# Patient Record
Sex: Female | Born: 1945 | Race: White | Hispanic: No | Smoking: Former smoker
Health system: Southern US, Community
[De-identification: ages and names within clinical notes are randomized; demographics above are authoritative.]

## PROBLEM LIST (undated history)

## (undated) DIAGNOSIS — E119 Type 2 diabetes mellitus without complications: Secondary | ICD-10-CM

## (undated) DIAGNOSIS — S42302A Unspecified fracture of shaft of humerus, left arm, initial encounter for closed fracture: Secondary | ICD-10-CM

## (undated) DIAGNOSIS — S82891A Other fracture of right lower leg, initial encounter for closed fracture: Secondary | ICD-10-CM

## (undated) DIAGNOSIS — E079 Disorder of thyroid, unspecified: Secondary | ICD-10-CM

## (undated) DIAGNOSIS — I10 Essential (primary) hypertension: Secondary | ICD-10-CM

## (undated) DIAGNOSIS — R32 Unspecified urinary incontinence: Secondary | ICD-10-CM

## (undated) HISTORY — DX: Unspecified urinary incontinence: R32

## (undated) HISTORY — DX: Disorder of thyroid, unspecified: E07.9

## (undated) HISTORY — DX: Essential (primary) hypertension: I10

## (undated) HISTORY — DX: Type 2 diabetes mellitus without complications: E11.9

## (undated) HISTORY — DX: Other fracture of right lower leg, initial encounter for closed fracture: S82.891A

---
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
## 1975-01-04 HISTORY — PX: ABDOMINAL HYSTERECTOMY: SHX81

## 1984-01-04 HISTORY — PX: THYROID SURGERY: SHX805

## 2002-05-01 ENCOUNTER — Emergency Department (HOSPITAL_COMMUNITY): Admission: EM | Admit: 2002-05-01 | Discharge: 2002-05-01 | Payer: Self-pay | Admitting: Emergency Medicine

## 2002-05-20 ENCOUNTER — Ambulatory Visit (HOSPITAL_COMMUNITY): Admission: RE | Admit: 2002-05-20 | Discharge: 2002-05-20 | Payer: Self-pay | Admitting: Gastroenterology

## 2003-02-11 ENCOUNTER — Encounter (INDEPENDENT_AMBULATORY_CARE_PROVIDER_SITE_OTHER): Payer: Self-pay | Admitting: *Deleted

## 2003-02-11 ENCOUNTER — Ambulatory Visit (HOSPITAL_COMMUNITY): Admission: RE | Admit: 2003-02-11 | Discharge: 2003-02-11 | Payer: Self-pay | Admitting: Endocrinology

## 2003-12-24 ENCOUNTER — Ambulatory Visit (HOSPITAL_COMMUNITY): Admission: RE | Admit: 2003-12-24 | Discharge: 2003-12-24 | Payer: Self-pay | Admitting: Endocrinology

## 2004-12-15 ENCOUNTER — Ambulatory Visit (HOSPITAL_COMMUNITY): Admission: RE | Admit: 2004-12-15 | Discharge: 2004-12-15 | Payer: Self-pay | Admitting: Endocrinology

## 2006-12-26 ENCOUNTER — Encounter: Admission: RE | Admit: 2006-12-26 | Discharge: 2006-12-26 | Payer: Self-pay | Admitting: Endocrinology

## 2007-07-20 DIAGNOSIS — M19029 Primary osteoarthritis, unspecified elbow: Secondary | ICD-10-CM | POA: Insufficient documentation

## 2008-07-10 ENCOUNTER — Encounter: Admission: RE | Admit: 2008-07-10 | Discharge: 2008-07-10 | Payer: Self-pay | Admitting: Endocrinology

## 2009-08-05 ENCOUNTER — Encounter: Admission: RE | Admit: 2009-08-05 | Discharge: 2009-08-05 | Payer: Self-pay | Admitting: Endocrinology

## 2009-10-16 ENCOUNTER — Encounter: Admission: RE | Admit: 2009-10-16 | Discharge: 2009-10-16 | Payer: Self-pay | Admitting: Family Medicine

## 2010-01-24 ENCOUNTER — Encounter: Payer: Self-pay | Admitting: Endocrinology

## 2010-04-15 DIAGNOSIS — E785 Hyperlipidemia, unspecified: Secondary | ICD-10-CM | POA: Insufficient documentation

## 2010-04-15 DIAGNOSIS — E039 Hypothyroidism, unspecified: Secondary | ICD-10-CM | POA: Insufficient documentation

## 2010-04-15 DIAGNOSIS — I10 Essential (primary) hypertension: Secondary | ICD-10-CM | POA: Insufficient documentation

## 2010-05-21 NOTE — Op Note (Signed)
NAME:  [name], Helen                            ACCOUNT NO.:  [id number]   MEDICAL RECORD NO.:  [id number]                   PATIENT TYPE:  AMB   LOCATION:  ENDO                                 FACILITY:  [facility]   PHYSICIAN:  Anselmo Rod, M.D.               DATE OF BIRTH:  [birth year]   DATE OF PROCEDURE:  05/20/2002  DATE OF DISCHARGE:                                 OPERATIVE REPORT   PROCEDURE PERFORMED:  Screening colonoscopy.   ENDOSCOPIST:  Anselmo Rod, M.D.   INSTRUMENT USED:  Olympus video colonoscope.   INDICATIONS FOR PROCEDURE:  A 65 year old white female with a history of  occasional bright red blood per rectum.  Rule out colonic polyps, masses,  hemorrhoids, etc.   PREPROCEDURE PREPARATION:  Informed consent was procured from the patient.  The patient was fasted for eight hours prior to the procedure and prepped  with a bottle of magnesium citrate and a gallon of GoLYTELY the night prior  to the procedure.   PREPROCEDURE PHYSICAL:  VITAL SIGNS:  The patient had stable vital signs.  NECK:  Supple.  CHEST: Clear to auscultation.  S1 and S2 regular.  ABDOMEN: Soft with normal bowel sounds.   DESCRIPTION OF PROCEDURE:  The patient was placed in the left lateral  decubitus position and sedated with 70 mg of Demerol and 7 mg of Versed  intravenously.  Once the patient was adequately sedated and maintained on  low flow oxygen and continuous cardiac monitoring, the Olympus video  colonoscope was advanced from the rectum to the cecum without difficulty.  Some residual stool was present in the colon.  Multiple washings were done.  Scattered diverticula of varying size was seen throughout the colon.  A  small amount of internal and external hemorrhoids were seen on retroflexion  of the anus and rectum, respectively.  The procedure was completed.  The  area of the appendiceal orifice and ileocecal valve were clearly visualized  and photographed.  No masses, polyps,  erosions, ulcerations, etc., were  appreciated.  The patient tolerated the procedure well without  complications.   IMPRESSION:  1. Scattered diverticulosis.  2. A small amount of internal and external hemorrhoids.  3. No masses or polyps seen.    RECOMMENDATIONS:  1. High fiber diet with 20-25 g of fiber in the diet has been recommended     with liberal fluid intake.  2. Outpatient follow up in the next two weeks for further recommendations.     The patient has been asked to call the office earlier if she has any     problems in the interim.  Anselmo Rod, M.D.    JNM/MEDQ  D:  05/20/2002  T:  05/20/2002  Job:  [id number]   cc:   Marjory Lies, M.D.  [address]  [place]  [place] [address]  Fax: [phone]

## 2010-10-01 ENCOUNTER — Other Ambulatory Visit: Payer: Self-pay | Admitting: Family Medicine

## 2010-10-01 DIAGNOSIS — Z1231 Encounter for screening mammogram for malignant neoplasm of breast: Secondary | ICD-10-CM

## 2010-11-04 ENCOUNTER — Ambulatory Visit
Admission: RE | Admit: 2010-11-04 | Discharge: 2010-11-04 | Disposition: A | Discharge status: Home / Self Care | Payer: No Typology Code available for payment source | Source: Ambulatory Visit | Attending: Family Medicine | Admitting: Family Medicine

## 2010-11-04 DIAGNOSIS — Z1231 Encounter for screening mammogram for malignant neoplasm of breast: Secondary | ICD-10-CM

## 2012-01-04 DIAGNOSIS — S82891A Other fracture of right lower leg, initial encounter for closed fracture: Secondary | ICD-10-CM

## 2012-01-04 HISTORY — DX: Other fracture of right lower leg, initial encounter for closed fracture: S82.891A

## 2012-01-27 ENCOUNTER — Other Ambulatory Visit: Payer: Self-pay | Admitting: Nurse Practitioner

## 2012-01-27 DIAGNOSIS — Z1231 Encounter for screening mammogram for malignant neoplasm of breast: Secondary | ICD-10-CM

## 2012-02-23 ENCOUNTER — Ambulatory Visit
Admission: RE | Admit: 2012-02-23 | Discharge: 2012-02-23 | Disposition: A | Discharge status: Home / Self Care | Payer: Medicare Other | Source: Ambulatory Visit | Attending: Nurse Practitioner | Admitting: Nurse Practitioner

## 2012-02-23 DIAGNOSIS — Z1231 Encounter for screening mammogram for malignant neoplasm of breast: Secondary | ICD-10-CM

## 2012-08-27 ENCOUNTER — Other Ambulatory Visit: Payer: Self-pay | Admitting: Advanced Practice Midwife

## 2012-08-29 ENCOUNTER — Other Ambulatory Visit: Payer: Self-pay | Admitting: Advanced Practice Midwife

## 2012-08-31 ENCOUNTER — Other Ambulatory Visit: Payer: Self-pay | Admitting: Advanced Practice Midwife

## 2012-09-04 ENCOUNTER — Other Ambulatory Visit: Payer: Self-pay | Admitting: *Deleted

## 2012-09-05 NOTE — Telephone Encounter (Signed)
2nd voicemail left to [facility] that she is not a patient here

## 2013-03-22 ENCOUNTER — Other Ambulatory Visit: Payer: Self-pay | Admitting: Nurse Practitioner

## 2013-03-22 ENCOUNTER — Other Ambulatory Visit: Payer: Self-pay

## 2013-03-22 DIAGNOSIS — Z1231 Encounter for screening mammogram for malignant neoplasm of breast: Secondary | ICD-10-CM

## 2013-04-15 ENCOUNTER — Other Ambulatory Visit: Payer: Self-pay | Admitting: Nurse Practitioner

## 2013-04-15 DIAGNOSIS — Z780 Asymptomatic menopausal state: Secondary | ICD-10-CM

## 2013-04-23 ENCOUNTER — Ambulatory Visit
Admission: RE | Admit: 2013-04-23 | Discharge: 2013-04-23 | Disposition: A | Discharge status: Home / Self Care | Payer: Medicare Other | Source: Ambulatory Visit | Attending: Nurse Practitioner | Admitting: Nurse Practitioner

## 2013-04-23 ENCOUNTER — Encounter (INDEPENDENT_AMBULATORY_CARE_PROVIDER_SITE_OTHER): Payer: Self-pay

## 2013-04-23 DIAGNOSIS — Z780 Asymptomatic menopausal state: Secondary | ICD-10-CM

## 2013-04-23 DIAGNOSIS — Z1231 Encounter for screening mammogram for malignant neoplasm of breast: Secondary | ICD-10-CM

## 2013-06-26 DIAGNOSIS — Z794 Long term (current) use of insulin: Secondary | ICD-10-CM | POA: Insufficient documentation

## 2014-03-28 ENCOUNTER — Other Ambulatory Visit: Payer: Self-pay

## 2014-03-28 DIAGNOSIS — Z1231 Encounter for screening mammogram for malignant neoplasm of breast: Secondary | ICD-10-CM

## 2014-04-28 ENCOUNTER — Ambulatory Visit
Admission: RE | Admit: 2014-04-28 | Discharge: 2014-04-28 | Disposition: A | Discharge status: Home / Self Care | Payer: Medicare Other | Source: Ambulatory Visit

## 2014-04-28 DIAGNOSIS — Z1231 Encounter for screening mammogram for malignant neoplasm of breast: Secondary | ICD-10-CM

## 2015-05-14 ENCOUNTER — Other Ambulatory Visit: Payer: Self-pay

## 2015-05-14 DIAGNOSIS — Z1231 Encounter for screening mammogram for malignant neoplasm of breast: Secondary | ICD-10-CM

## 2015-05-26 ENCOUNTER — Ambulatory Visit: Payer: Medicare Other

## 2015-06-05 ENCOUNTER — Ambulatory Visit
Admission: RE | Admit: 2015-06-05 | Discharge: 2015-06-05 | Disposition: A | Discharge status: Home / Self Care | Payer: Medicare Other | Source: Ambulatory Visit

## 2015-06-05 DIAGNOSIS — Z1231 Encounter for screening mammogram for malignant neoplasm of breast: Secondary | ICD-10-CM

## 2015-08-29 ENCOUNTER — Other Ambulatory Visit: Payer: Self-pay | Admitting: Nurse Practitioner

## 2015-08-29 DIAGNOSIS — M810 Age-related osteoporosis without current pathological fracture: Secondary | ICD-10-CM

## 2015-09-01 ENCOUNTER — Ambulatory Visit
Admission: RE | Admit: 2015-09-01 | Discharge: 2015-09-01 | Disposition: A | Discharge status: Home / Self Care | Payer: Medicare Other | Source: Ambulatory Visit | Attending: Nurse Practitioner | Admitting: Nurse Practitioner

## 2015-09-01 DIAGNOSIS — M810 Age-related osteoporosis without current pathological fracture: Secondary | ICD-10-CM

## 2016-03-31 ENCOUNTER — Other Ambulatory Visit: Payer: Self-pay | Admitting: Endocrinology

## 2016-03-31 DIAGNOSIS — R197 Diarrhea, unspecified: Secondary | ICD-10-CM

## 2016-04-07 ENCOUNTER — Ambulatory Visit
Admission: RE | Admit: 2016-04-07 | Discharge: 2016-04-07 | Disposition: A | Discharge status: Home / Self Care | Payer: Medicare Other | Source: Ambulatory Visit | Attending: Endocrinology | Admitting: Endocrinology

## 2016-04-07 DIAGNOSIS — R197 Diarrhea, unspecified: Secondary | ICD-10-CM

## 2016-04-29 ENCOUNTER — Other Ambulatory Visit: Payer: Self-pay | Admitting: Nurse Practitioner

## 2016-04-29 DIAGNOSIS — Z1231 Encounter for screening mammogram for malignant neoplasm of breast: Secondary | ICD-10-CM

## 2016-06-06 ENCOUNTER — Ambulatory Visit
Admission: RE | Admit: 2016-06-06 | Discharge: 2016-06-06 | Disposition: A | Discharge status: Home / Self Care | Payer: Medicare Other | Source: Ambulatory Visit | Attending: Nurse Practitioner | Admitting: Nurse Practitioner

## 2016-06-06 DIAGNOSIS — Z1231 Encounter for screening mammogram for malignant neoplasm of breast: Secondary | ICD-10-CM

## 2016-09-14 DIAGNOSIS — E119 Type 2 diabetes mellitus without complications: Secondary | ICD-10-CM | POA: Insufficient documentation

## 2017-03-15 ENCOUNTER — Other Ambulatory Visit: Payer: Self-pay | Admitting: Internal Medicine

## 2017-03-15 DIAGNOSIS — E042 Nontoxic multinodular goiter: Secondary | ICD-10-CM

## 2017-03-22 ENCOUNTER — Ambulatory Visit
Admission: RE | Admit: 2017-03-22 | Discharge: 2017-03-22 | Disposition: A | Discharge status: Home / Self Care | Payer: Medicare Other | Source: Ambulatory Visit | Attending: Internal Medicine | Admitting: Internal Medicine

## 2017-03-22 DIAGNOSIS — E042 Nontoxic multinodular goiter: Secondary | ICD-10-CM

## 2017-04-10 ENCOUNTER — Encounter: Payer: Self-pay | Admitting: Registered"

## 2017-04-10 ENCOUNTER — Encounter: Payer: Medicare Other | Attending: Internal Medicine | Admitting: Registered"

## 2017-04-10 DIAGNOSIS — Z713 Dietary counseling and surveillance: Secondary | ICD-10-CM | POA: Diagnosis not present

## 2017-04-10 DIAGNOSIS — E119 Type 2 diabetes mellitus without complications: Secondary | ICD-10-CM

## 2017-04-10 NOTE — Patient Instructions (Addendum)
Goals:  Follow Diabetes Meal Plan as instructed  Eat 3 meals and 2 snacks, every 3-5 hrs  Limit carbohydrate intake to 30-45 grams carbohydrate/meal  Limit carbohydrate intake to 15 grams carbohydrate/snack  Add lean protein foods to meals/snacks  Monitor glucose levels as instructed by your doctor  Aim for 30 mins of physical activity daily  Bring food record and glucose log to your next nutrition visit  Follow-up yearly or as needed with dietitian

## 2017-04-10 NOTE — Progress Notes (Signed)
Diabetes Self-Management Education  Visit Type: First/Initial  Appt. Start Time: 10:45 Appt. End Time: 11:50  04/10/2017  Pt expectations: learn how to read labels related to carbohydrates and protein; also learn about portion sizes  [name], identified by name and date of birth, is a 72 y.o. female with a diagnosis of Diabetes: Type 2. 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 Pt states she is lazy sometimes. Pt states she has supportive family members in this area and in [place], [place]. Pt states she plans to join [employer] to establish physical activity routine of 3 days/week.   ASSESSMENT  Height 5\' 4"  (1.626 m), weight 177 lb 12.8 oz (80.6 kg). Body mass index is 30.52 kg/m.  Diabetes Self-Management Education - 04/10/17 1047      Visit Information   Visit Type  First/Initial      Initial Visit   Diabetes Type  Type 2    Are you currently following a meal plan?  Yes    What type of meal plan do you follow?  Low carb    Are you taking your medications as prescribed?  Yes    Date Diagnosed  1990's      Health Coping   How would you rate your overall health?  Fair      Psychosocial Assessment   Patient Belief/Attitude about Diabetes  Motivated to manage diabetes    Self-care barriers  None    Self-management support  Doctor's office    Other persons present  Patient    Patient Concerns  Nutrition/Meal planning    Special Needs  None    Preferred Learning Style  No preference indicated    Learning Readiness  Ready    How often do you need to have someone help you when you read instructions, pamphlets, or other written materials from your doctor or pharmacy?  1 - Never    What is the last grade level you completed in school?  76PP grade      Complications   Last HgB A1C per patient/outside source  8.1 %    How often do you check your blood sugar?  3-4 times/day    Fasting Blood glucose  range (mg/dL)  70-129    Postprandial Blood glucose range (mg/dL)  130-179    Number of hypoglycemic episodes per month  2    Can you tell when your blood sugar is low?  Yes    What do you do if your blood sugar is low?  eats crackers     Number of hyperglycemic episodes per week  1    Can you tell when your blood sugar is high?  No    Have you had a dilated eye exam in the past 12 months?  Yes    Have you had a dental exam in the past 12 months?  Yes    Are you checking your feet?  Yes    How many days per week are you checking your feet?  2      Dietary Intake   Breakfast  2 boiled eggs, bacon    Snack (morning)  none    Lunch  salad, tuna    Snack (afternoon)  none    Dinner  stirred fry chicken, peppers, broccoli, onions    Snack (evening)  none    Beverage(s)  water, diet Sundrop, crystal light  Exercise   Exercise Type  Light (walking / raking leaves)    How many days per week to you exercise?  3    How many minutes per day do you exercise?  15    Total minutes per week of exercise  45      Patient Education   Disease state   Definition of diabetes, type 1 and 2, and the diagnosis of diabetes;Factors that contribute to the development of diabetes    Nutrition management   Carbohydrate counting;Food label reading, portion sizes and measuring food.;Reviewed blood glucose goals for pre and post meals and how to evaluate the patients' food intake on their blood glucose level.;Meal options for control of blood glucose level and chronic complications.    Physical activity and exercise   Role of exercise on diabetes management, blood pressure control and cardiac health.;Helped patient identify appropriate exercises in relation to his/her diabetes, diabetes complications and other health issue.    Monitoring  Purpose and frequency of SMBG.;Identified appropriate SMBG and/or A1C goals.;Taught/discussed recording of test results and interpretation of SMBG.;Interpreting lab values -  A1C, lipid, urine microalbumina.;Yearly dilated eye exam;Daily foot exams    Acute complications  Taught treatment of hypoglycemia - the 15 rule.;Discussed and identified patients' treatment of hyperglycemia.    Chronic complications  Relationship between chronic complications and blood glucose control;Lipid levels, blood glucose control and heart disease;Dental care;Assessed and discussed foot care and prevention of foot problems;Identified and discussed with patient  current chronic complications;Retinopathy and reason for yearly dilated eye exams;Reviewed with patient heart disease, higher risk of, and prevention    Psychosocial adjustment  Helped patient identify a support system for diabetes management;Identified and addressed patients feelings and concerns about diabetes;Role of stress on diabetes      Individualized Goals (developed by patient)   Nutrition  General guidelines for healthy choices and portions discussed    Physical Activity  Exercise 3-5 times per week    Medications  take my medication as prescribed    Monitoring   test my blood glucose as discussed    Reducing Risk  treat hypoglycemia with 15 grams of carbs if blood glucose less than 70mg /dL;do foot checks daily    Health Coping  Not Applicable      Post-Education Assessment   Patient understands the diabetes disease and treatment process.  Demonstrates understanding / competency    Patient understands incorporating nutritional management into lifestyle.  Demonstrates understanding / competency    Patient undertands incorporating physical activity into lifestyle.  Demonstrates understanding / competency    Patient understands using medications safely.  Demonstrates understanding / competency    Patient understands monitoring blood glucose, interpreting and using results  Demonstrates understanding / competency    Patient understands prevention, detection, and treatment of acute complications.  Demonstrates understanding /  competency    Patient understands prevention, detection, and treatment of chronic complications.  Demonstrates understanding / competency    Patient understands how to develop strategies to address psychosocial issues.  Demonstrates understanding / competency    Patient understands how to develop strategies to promote health/change behavior.  Demonstrates understanding / competency      Outcomes   Expected Outcomes  Demonstrated interest in learning. Expect positive outcomes    Future DMSE  Other (comment) pt wants to return in 7 months before the holiday season begins    Program Status  Completed       Individualized Plan for Diabetes Self-Management Training:   Learning Objective:  Patient will have a greater understanding of diabetes self-management. Patient education plan is to attend individual and/or group sessions per assessed needs and concerns.   Plan:   Patient Instructions  Goals:  Follow Diabetes Meal Plan as instructed  Eat 3 meals and 2 snacks, every 3-5 hrs  Limit carbohydrate intake to 30-45 grams carbohydrate/meal  Limit carbohydrate intake to 15 grams carbohydrate/snack  Add lean protein foods to meals/snacks  Monitor glucose levels as instructed by your doctor  Aim for 30 mins of physical activity daily  Bring food record and glucose log to your next nutrition visit  Follow-up yearly or as needed with dietitian      Expected Outcomes:  Demonstrated interest in learning. Expect positive outcomes  Education material provided: Living Well with Diabetes, Food label handouts, Meal plan card, Support group flyer and Carbohydrate counting sheet  If problems or questions, patient to contact team via:  Phone and Email  Future DSME appointment: Other (comment)(pt wants to return in 7 months before the holiday season begins)

## 2017-05-30 ENCOUNTER — Emergency Department (HOSPITAL_COMMUNITY): Payer: Medicare Other

## 2017-05-30 ENCOUNTER — Other Ambulatory Visit: Payer: Self-pay

## 2017-05-30 ENCOUNTER — Emergency Department (HOSPITAL_COMMUNITY)
Admission: EM | Admit: 2017-05-30 | Discharge: 2017-05-30 | Disposition: A | Discharge status: Home / Self Care | Payer: Medicare Other | Attending: Emergency Medicine | Admitting: Emergency Medicine

## 2017-05-30 ENCOUNTER — Encounter (HOSPITAL_COMMUNITY): Payer: Self-pay | Admitting: *Deleted

## 2017-05-30 DIAGNOSIS — R197 Diarrhea, unspecified: Secondary | ICD-10-CM | POA: Insufficient documentation

## 2017-05-30 DIAGNOSIS — R1013 Epigastric pain: Secondary | ICD-10-CM | POA: Insufficient documentation

## 2017-05-30 DIAGNOSIS — I10 Essential (primary) hypertension: Secondary | ICD-10-CM | POA: Insufficient documentation

## 2017-05-30 DIAGNOSIS — R112 Nausea with vomiting, unspecified: Secondary | ICD-10-CM | POA: Insufficient documentation

## 2017-05-30 DIAGNOSIS — Z7984 Long term (current) use of oral hypoglycemic drugs: Secondary | ICD-10-CM | POA: Insufficient documentation

## 2017-05-30 DIAGNOSIS — Z87891 Personal history of nicotine dependence: Secondary | ICD-10-CM | POA: Diagnosis not present

## 2017-05-30 DIAGNOSIS — N9489 Other specified conditions associated with female genital organs and menstrual cycle: Secondary | ICD-10-CM

## 2017-05-30 DIAGNOSIS — Z79899 Other long term (current) drug therapy: Secondary | ICD-10-CM | POA: Diagnosis not present

## 2017-05-30 DIAGNOSIS — E119 Type 2 diabetes mellitus without complications: Secondary | ICD-10-CM | POA: Diagnosis not present

## 2017-05-30 DIAGNOSIS — R079 Chest pain, unspecified: Secondary | ICD-10-CM | POA: Diagnosis present

## 2017-05-30 DIAGNOSIS — R0789 Other chest pain: Secondary | ICD-10-CM

## 2017-05-30 DIAGNOSIS — N83209 Unspecified ovarian cyst, unspecified side: Secondary | ICD-10-CM | POA: Insufficient documentation

## 2017-05-30 DIAGNOSIS — Z7982 Long term (current) use of aspirin: Secondary | ICD-10-CM | POA: Diagnosis not present

## 2017-05-30 LAB — CBC
HEMATOCRIT: 41.8 % (ref 36.0–46.0)
Hemoglobin: 13.2 g/dL (ref 12.0–15.0)
MCH: 27.3 pg (ref 26.0–34.0)
MCHC: 31.6 g/dL (ref 30.0–36.0)
MCV: 86.5 fL (ref 78.0–100.0)
Platelets: 373 10*3/uL (ref 150–400)
RBC: 4.83 MIL/uL (ref 3.87–5.11)
RDW: 13.1 % (ref 11.5–15.5)
WBC: 10.7 10*3/uL — ABNORMAL HIGH (ref 4.0–10.5)

## 2017-05-30 LAB — HEPATIC FUNCTION PANEL
ALK PHOS: 38 U/L (ref 38–126)
ALT: 19 U/L (ref 14–54)
AST: 19 U/L (ref 15–41)
Albumin: 3.9 g/dL (ref 3.5–5.0)
Bilirubin, Direct: <0.1 mg/dL — ABNORMAL LOW (ref 0.1–0.5)
Total Bilirubin: 0.7 mg/dL (ref 0.3–1.2)
Total Protein: 6.9 g/dL (ref 6.5–8.1)

## 2017-05-30 LAB — BASIC METABOLIC PANEL
Anion gap: 10 (ref 5–15)
BUN: 28 mg/dL — AB (ref 6–20)
CHLORIDE: 102 mmol/L (ref 101–111)
CO2: 23 mmol/L (ref 22–32)
CREATININE: 0.67 mg/dL (ref 0.44–1.00)
Calcium: 9.2 mg/dL (ref 8.9–10.3)
GFR calc Af Amer: >60 mL/min (ref >60)
Glucose, Bld: 150 mg/dL — ABNORMAL HIGH (ref 65–99)
POTASSIUM: 4 mmol/L (ref 3.5–5.1)
Sodium: 135 mmol/L (ref 135–145)

## 2017-05-30 LAB — I-STAT TROPONIN, ED: Troponin i, poc: 0 ng/mL (ref 0.00–0.08)

## 2017-05-30 LAB — LIPASE, BLOOD: Lipase: 36 U/L (ref 11–51)

## 2017-05-30 LAB — TROPONIN I: Troponin I: <0.03 ng/mL (ref <0.03)

## 2017-05-30 MED ORDER — MORPHINE SULFATE (PF) 4 MG/ML IV SOLN
4.0000 mg | Freq: Once | INTRAVENOUS | Status: AC
  Administered 2017-05-30: 4 mg via INTRAVENOUS
  Filled 2017-05-30: qty 1

## 2017-05-30 MED ORDER — ONDANSETRON HCL 4 MG/2ML IJ SOLN
4.0000 mg | Freq: Once | INTRAMUSCULAR | Status: AC
  Administered 2017-05-30: 4 mg via INTRAVENOUS
  Filled 2017-05-30: qty 2

## 2017-05-30 MED ORDER — NITROGLYCERIN 2 % TD OINT
0.5000 [in_us] | TOPICAL_OINTMENT | Freq: Once | TRANSDERMAL | Status: AC
  Administered 2017-05-30: 0.5 [in_us] via TOPICAL
  Filled 2017-05-30: qty 1

## 2017-05-30 MED ORDER — IOPAMIDOL (ISOVUE-300) INJECTION 61%
100.0000 mL | Freq: Once | INTRAVENOUS | Status: AC | PRN
  Administered 2017-05-30: 100 mL via INTRAVENOUS

## 2017-05-30 MED ORDER — FAMOTIDINE IN NACL 20-0.9 MG/50ML-% IV SOLN
20.0000 mg | Freq: Once | INTRAVENOUS | Status: AC
  Administered 2017-05-30: 20 mg via INTRAVENOUS
  Filled 2017-05-30: qty 50

## 2017-05-30 NOTE — ED Provider Notes (Signed)
[facility] EMERGENCY DEPARTMENT Provider Note   CSN: [id number] Arrival date & time: 05/30/17  0501  Time seen 05:50 AM   History   Chief Complaint Chief Complaint  Patient presents with  . Chest Pain    HPI Helen [name] is a 72 y.o. female.  HPI patient reports about 2 AM she started having lower sternal central chest pain that she describes as pressure.  It does not radiate.  She denies shortness of breath but has had nausea and vomiting.  911 had her to 4 baby aspirin and she states now she is having some reflux with burning fluid from the aspirin.  She states she is never had chest pain like this before.  She denies any history of heart problems.  She does have hypertension and diabetes.  Past Medical History:  Diagnosis Date  . Diabetes mellitus without complication ([place])   . Hypertension     There are no active problems to display for this patient.   History reviewed. No pertinent surgical history.   OB History   None      Home Medications    Prior to Admission medications   Medication Sig Start Date End Date Taking? Authorizing Provider  amLODipine (NORVASC) 10 MG tablet Take 10 mg by mouth daily.   Yes [provider]  amlodipine-atorvastatin (CADUET) 10-10 MG tablet Take 1 tablet by mouth daily.   Yes [provider]  aspirin EC 81 MG tablet Take 81 mg by mouth daily.   Yes [provider]  empagliflozin (JARDIANCE) 25 MG TABS tablet Take 25 mg by mouth daily.   Yes [provider]  fenofibrate 160 MG tablet Take 160 mg by mouth daily.   Yes [provider]  FLUoxetine (PROZAC) 40 MG capsule Take 40 mg by mouth daily.   Yes [provider]  glimepiride (AMARYL) 4 MG tablet Take 4 mg by mouth daily with breakfast.   Yes [provider]  lisinopril-hydrochlorothiazide (PRINZIDE,ZESTORETIC) 20-25 MG tablet Take 1 tablet by mouth daily.   Yes [provider]  meclizine (ANTIVERT) 12.5 MG  tablet Take 12.5 mg by mouth 3 (three) times daily as needed for dizziness.   Yes [provider]  metFORMIN (GLUCOPHAGE) 500 MG tablet Take by mouth 2 (two) times daily with a meal.   Yes [provider]  metoprolol succinate (TOPROL-XL) 100 MG 24 hr tablet Take 100 mg by mouth daily. Take with or immediately following a meal.   Yes [provider]  MULTIPLE VITAMINS ESSENTIAL PO Take by mouth.   Yes [provider]  omeprazole (PRILOSEC) 20 MG capsule Take 20 mg by mouth daily.   Yes [provider]  simvastatin (ZOCOR) 40 MG tablet Take 40 mg by mouth daily.   Yes [provider]    Family History Family History  Problem Relation Age of Onset  . Breast cancer Mother 81  . Cancer Other   . Hypertension Other   . Stroke Other   . Heart disease Other   . Diabetes Other     Social History Social History   Tobacco Use  . Smoking status: Former Research scientist (life sciences)  . Smokeless tobacco: Never Used  Substance Use Topics  . Alcohol use: Not on file  . Drug use: Not on file  lives at home Denies smoking   Allergies   Patient has no known allergies.   Review of Systems Review of Systems  All other systems reviewed and are negative.  Physical Exam Updated Vital Signs BP (!) 121/53   Pulse 63   Temp 98.2 F (36.8 C) (Oral)   Resp 13   Ht 5\' 4"  (1.626 m)   Wt 80.3 kg (177 lb)   SpO2 94%   BMI 30.38 kg/m   Physical Exam  Constitutional: She is oriented to person, place, and time. She appears well-developed and well-nourished.  Non-toxic appearance. She does not appear ill. She appears distressed.  Gagging during exam  HENT:  Head: Normocephalic and atraumatic.  Right Ear: External ear normal.  Left Ear: External ear normal.  Nose: Nose normal. No mucosal edema or rhinorrhea.  Mouth/Throat: Oropharynx is clear and moist and mucous membranes are normal. No dental abscesses or uvula swelling.  Eyes: Pupils are equal, round,  and reactive to light. Conjunctivae and EOM are normal.  Neck: Normal range of motion and full passive range of motion without pain. Neck supple.  Cardiovascular: Normal rate, regular rhythm and normal heart sounds. Exam reveals no gallop and no friction rub.  No murmur heard. Pulmonary/Chest: Effort normal and breath sounds normal. No respiratory distress. She has no wheezes. She has no rhonchi. She has no rales. She exhibits no tenderness and no crepitus.  Area of pain noted    Abdominal: Soft. Normal appearance and bowel sounds are normal. She exhibits no distension. There is no tenderness. There is no rebound and no guarding.  Musculoskeletal: Normal range of motion. She exhibits no edema or tenderness.  Moves all extremities well.   Neurological: She is alert and oriented to person, place, and time. She has normal strength. No cranial nerve deficit.  Skin: Skin is warm and intact. No rash noted. She is diaphoretic. No erythema. No pallor.  Psychiatric: She has a normal mood and affect. Her speech is normal and behavior is normal. Her mood appears not anxious.  Nursing note and vitals reviewed.    ED Treatments / Results  Labs (all labs ordered are listed, but only abnormal results are displayed) Results for orders placed or performed during the hospital encounter of 06/26/[birth year]  Basic metabolic panel  Result Value Ref Range   Sodium 135 135 - 145 mmol/L   Potassium 4.0 3.5 - 5.1 mmol/L   Chloride 102 101 - 111 mmol/L   CO2 23 22 - 32 mmol/L   Glucose, Bld 150 (H) 65 - 99 mg/dL   BUN 28 (H) 6 - 20 mg/dL   Creatinine, Ser 0.67 0.44 - 1.00 mg/dL   Calcium 9.2 8.9 - 10.3 mg/dL   GFR calc non Af Amer >60 >60 mL/min   GFR calc Af Amer >60 >60 mL/min   Anion gap 10 5 - 15  CBC  Result Value Ref Range   WBC 10.7 (H) 4.0 - 10.5 K/uL   RBC 4.83 3.87 - 5.11 MIL/uL   Hemoglobin 13.2 12.0 - 15.0 g/dL   HCT 41.8 36.0 - 46.0 %   MCV 86.5 78.0 - 100.0 fL   MCH 27.3 26.0 - 34.0 pg   MCHC  31.6 30.0 - 36.0 g/dL   RDW 13.1 11.5 - 15.5 %   Platelets 373 150 - 400 K/uL  Hepatic function panel  Result Value Ref Range   Total Protein 6.9 6.5 - 8.1 g/dL   Albumin 3.9 3.5 - 5.0 g/dL   AST 19 15 - 41 U/L   ALT 19 14 - 54 U/L   Alkaline Phosphatase 38 38 - 126 U/L   Total Bilirubin 0.7 0.3 -  1.2 mg/dL   Bilirubin, Direct <0.1 (L) 0.1 - 0.5 mg/dL   Indirect Bilirubin NOT CALCULATED 0.3 - 0.9 mg/dL  Lipase, blood  Result Value Ref Range   Lipase 36 11 - 51 U/L  I-stat troponin, ED  Result Value Ref Range   Troponin i, poc 0.00 0.00 - 0.08 ng/mL   Comment 3           Laboratory interpretation all normal except mild leukocytosis, mild hyperglycemia     EKG EKG Interpretation  Date/Time:  Tuesday May 30 2017 05:20:38 EDT Ventricular Rate:  59 PR Interval:    QRS Duration: 93 QT Interval:  465 QTC Calculation: 461 R Axis:   27 Text Interpretation:  Sinus rhythm Normal ECG No old tracing to compare Confirmed by Rolland Porter [phone]) on 05/30/2017 6:02:06 AM   Radiology Dg Chest Port 1 View  Result Date: 05/30/2017 CLINICAL DATA:  Central chest pressure woke patient up around 2 a.m. Nausea. 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 EXAM: PORTABLE CHEST 1 VIEW COMPARISON:  11/03/2006 FINDINGS: The heart size and mediastinal contours are within normal limits. Both lungs are clear. The visualized skeletal structures are unremarkable. IMPRESSION: No active disease. Electronically Signed   By: Lucienne Capers M.D.   On: 05/30/2017 06:06    Procedures Procedures (including critical care time)  Medications Ordered in ED Medications  famotidine (PEPCID) IVPB 20 mg premix (20 mg Intravenous New Bag/Given 05/30/17 0709)  ondansetron (ZOFRAN) injection 4 mg (4 mg Intravenous Given 05/30/17 0525)  ondansetron (ZOFRAN) injection 4 mg (4 mg Intravenous Given 05/30/17 0557)  nitroGLYCERIN (NITROGLYN) 2 % ointment 0.5 inch (0.5 inches Topical Given 05/30/17 0603)  morphine  4 MG/ML injection 4 mg (4 mg Intravenous Given 05/30/17 0603)     Initial Impression / Assessment and Plan / ED Course  I have reviewed the triage vital signs and the nursing notes.  Pertinent labs & imaging results that were available during my care of the patient were reviewed by me and considered in my medical decision making (see chart for details).    Patient appears distressed, she was given Zofran twice for nausea and nitroglycerin paste.  She was given IV morphine.  Recheck at 6:50 AM her blood tests have resulted and are all basically normal.  We discussed getting a second troponin in a couple hours, she also was recently started on doxycycline which she is never taken before.  Sometimes it can cause a lot of GI distress.  Also gallbladder disease can mimic an MI.  Therefore ultrasound of the gallbladder and CT the abdomen pelvis is going to be done.  Patient states her pain is almost gone now.  She appears to feel better.  PT left with Dr Thurnell Garbe at change of shift to get her [place] RUQ and AP CT results and her delta troponin.    Final Clinical Impressions(s) / ED Diagnoses   Final diagnoses:  Atypical chest pain    Disposition pending  Rolland Porter, MD, Barbette Or, MD 05/30/17 0730

## 2017-05-30 NOTE — Discharge Instructions (Signed)
Your CT scan showed an incidental finding:  "Approximately 3.2 cm hypoattenuating right-sided adnexal lesion, potentially a complicated/hemorrhagic ovarian cyst, however this is an atypical finding in a postmenopausal patient. Further evaluation with nonemergent pelvic ultrasound could be performed as indicated."  Call your OB/GYN doctor today to schedule a follow up appointment within the next week for this finding.  Take your usual prescriptions as previously directed.  Call your regular medical doctor today to schedule a follow up appointment within the next 2 days. Call the Cardiologist today to schedule a follow up appointment within the next week.  Return to the Emergency Department immediately sooner if worsening.

## 2017-05-30 NOTE — ED Triage Notes (Signed)
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

## 2017-05-30 NOTE — ED Notes (Signed)
ED Provider at bedside. 

## 2017-05-30 NOTE — ED Notes (Signed)
Pt c/o mid center chest pressure that woke her up from sleep, pain is non radiating, is associated with nausea, pt pale, skin clammy, pt nsr to sinus brady on monitor, ekg performed, given to Dr Tomi Bamberger,

## 2017-05-30 NOTE — ED Provider Notes (Signed)
Pt received at sign out with delta troponin, CT A/P and [place] RUQ pending. Pt states she woke up at 0200 with lower mid-sternal chest "pressure," N/V and then one episode of diarrhea. Pt states after vomiting, she had "reflux with burning." States she was recently started on doxycycline. Pt states she began to feel better after the IV pain and nausea medications. Pt NAD, non-toxic appearing, resps easy, abd soft/NT. Pt states she wants to go home now. Doubt PE as cause for symptoms with low risk [place].  Doubt ACS as cause for symptoms with normal troponin x2 and unchanged EKG from previous after 6+ hours of several hours of constant/atypical symptoms. CT with incidental adnexal finding; will need OB/GYN MD f/u.  Dx and testing d/w pt and family.  Questions answered.  Verb understanding, agreeable to d/c home with outpt f/u.    BP (!) 119/57   Pulse 63   Temp 98.2 F (36.8 C) (Oral)   Resp 16   Ht 5\' 4"  (1.626 m)   Wt 80.3 kg (177 lb)   SpO2 96%   BMI 30.38 kg/m    Results for orders placed or performed during the hospital encounter of 59/56/38  Basic metabolic panel  Result Value Ref Range   Sodium 135 135 - 145 mmol/L   Potassium 4.0 3.5 - 5.1 mmol/L   Chloride 102 101 - 111 mmol/L   CO2 23 22 - 32 mmol/L   Glucose, Bld 150 (H) 65 - 99 mg/dL   BUN 28 (H) 6 - 20 mg/dL   Creatinine, Ser 0.67 0.44 - 1.00 mg/dL   Calcium 9.2 8.9 - 10.3 mg/dL   GFR calc non Af Amer >60 >60 mL/min   GFR calc Af Amer >60 >60 mL/min   Anion gap 10 5 - 15  CBC  Result Value Ref Range   WBC 10.7 (H) 4.0 - 10.5 K/uL   RBC 4.83 3.87 - 5.11 MIL/uL   Hemoglobin 13.2 12.0 - 15.0 g/dL   HCT 41.8 36.0 - 46.0 %   MCV 86.5 78.0 - 100.0 fL   MCH 27.3 26.0 - 34.0 pg   MCHC 31.6 30.0 - 36.0 g/dL   RDW 13.1 11.5 - 15.5 %   Platelets 373 150 - 400 K/uL  Hepatic function panel  Result Value Ref Range   Total Protein 6.9 6.5 - 8.1 g/dL   Albumin 3.9 3.5 - 5.0 g/dL   AST 19 15 - 41 U/L   ALT 19 14 - 54 U/L    Alkaline Phosphatase 38 38 - 126 U/L   Total Bilirubin 0.7 0.3 - 1.2 mg/dL   Bilirubin, Direct <0.1 (L) 0.1 - 0.5 mg/dL   Indirect Bilirubin NOT CALCULATED 0.3 - 0.9 mg/dL  Lipase, blood  Result Value Ref Range   Lipase 36 11 - 51 U/L  Troponin I  Result Value Ref Range   Troponin I <0.03 <0.03 ng/mL  Troponin I  Result Value Ref Range   Troponin I <0.03 <0.03 ng/mL  I-stat troponin, ED  Result Value Ref Range   Troponin i, poc 0.00 0.00 - 0.08 ng/mL   Comment 3           Ct Abdomen Pelvis W Contrast Result Date: 05/30/2017 CLINICAL DATA:  Epigastric abdominal pain. EXAM: CT ABDOMEN AND PELVIS WITH CONTRAST TECHNIQUE: Multidetector CT imaging of the abdomen and pelvis was performed using the standard protocol following bolus administration of intravenous contrast. CONTRAST:  173mL ISOVUE-300 IOPAMIDOL (ISOVUE-300) INJECTION 61% COMPARISON:  None. FINDINGS: Lower chest: Limited visualization of lower thorax demonstrates minimal subsegmental bibasilar atelectasis. No discrete focal airspace opacities. No pleural effusion. Normal heart size. Coronary artery calcifications. Calcifications of the mitral valve annulus. Trace amount of pericardial fluid, presumably physiologic. Hepatobiliary: Normal hepatic contour. No discrete hepatic lesions. Normal appearance of the gallbladder given degree distention. No radiopaque gallstones. No intra extrahepatic bili duct dilatation. No ascites. Pancreas: Normal appearance of the pancreas Spleen: Normal appearance of the spleen Adrenals/Urinary Tract: There is symmetric enhancement and excretion of the bilateral kidneys. No definite renal stones on this postcontrast examination bilateral subcentimeter hypoattenuating renal lesions are too small to adequately characterize though likely represent renal cysts. There is a minimal amount of grossly symmetric likely age and body habitus related perinephric stranding. No urine obstruction. Normal appearance of the  urinary bladder. Normal appearance the bilateral adrenal glands. Stomach/Bowel: Scattered minimal colonic diverticulosis without evidence of diverticulitis. Moderate colonic stool burden without evidence of enteric obstruction. Normal appearance of the terminal ileum and appendix. No pneumoperitoneum, pneumatosis or portal venous gas. Vascular/Lymphatic: Scattered atherosclerotic plaque within a normal caliber abdominal aorta. The major branch vessels of the abdominal aorta appear patent on this non CTA examination. Note is made of separate origins of the GDA, splenic and left gastric arteries all arising separately directly from the abdominal aorta in lieu of a conventional celiac trunk. Note is made of replaced right and left hepatic artery from the proximal SMA. No bulky retroperitoneal, mesenteric, pelvic or inguinal lymphadenopathy. Reproductive: Post hysterectomy. Note is made of an approximately 3.2 x 1.7 cm hypoattenuating right-sided adnexal lesion. No discrete left-sided adnexal lesions. No free fluid the pelvic cul-de-sac. Other: There is a minimal amount of subcutaneous edema about undersurface of left side of the abdominal pannus. Musculoskeletal: No acute or aggressive osseous abnormalities. 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 lumbar spine DDD, worse at L4-L5 and L5-S1 with disc space height loss, endplate irregularity and small posteriorly directed disc osteophyte complexes at these locations. IMPRESSION: 1. No explanation for patient's epigastric abdominal pain and nausea. Specifically, no evidence of enteric or urinary obstruction. 2. Approximately 3.2 cm hypoattenuating right-sided adnexal lesion, potentially a complicated/hemorrhagic ovarian cyst, however this is an atypical finding in a postmenopausal patient. Further evaluation with nonemergent pelvic ultrasound could be performed as indicated. 3. Aortic Atherosclerosis (ICD10-I70.0). Electronically Signed   By: Sandi Mariscal M.D.   On: 05/30/2017 08:10   Dg Chest Port 1 View Result Date: 05/30/2017 CLINICAL DATA:  Central chest pressure woke patient up around 2 a.m. Nausea. 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 EXAM: PORTABLE CHEST 1 VIEW COMPARISON:  11/03/2006 FINDINGS: The heart size and mediastinal contours are within normal limits. Both lungs are clear. The visualized skeletal structures are unremarkable. IMPRESSION: No active disease. Electronically Signed   By: Lucienne Capers M.D.   On: 05/30/2017 06:06   US Abdomen Limited Ruq Result Date: 05/30/2017 CLINICAL DATA:  Epigastric pain. EXAM: ULTRASOUND ABDOMEN LIMITED RIGHT UPPER QUADRANT COMPARISON:  04/07/2016. FINDINGS: Gallbladder: No gallstones or wall thickening visualized. No sonographic Murphy sign noted by sonographer. Common bile duct: Diameter: 3 mm Liver: Increased echogenicity consistent fatty infiltration and/or hepatocellular disease. No focal hepatic abnormality identified. Portal vein is patent on color Doppler imaging with normal direction of blood flow towards the liver. IMPRESSION: 1. Increased hepatic echogenicity consistent with fatty infiltration and /or hepatocellular disease. 2.  No acute or focal abnormality. Electronically Signed   By: Marcello Moores  Register  On: 05/30/2017 08:11      Francine Graven, DO 05/30/17 [phone]

## 2017-06-12 ENCOUNTER — Other Ambulatory Visit: Payer: Self-pay

## 2017-06-12 ENCOUNTER — Encounter: Payer: Self-pay | Admitting: Obstetrics & Gynecology

## 2017-06-12 ENCOUNTER — Ambulatory Visit: Payer: Medicare Other | Admitting: Obstetrics & Gynecology

## 2017-06-12 VITALS — BP 118/56 | HR 72 | Resp 16 | Ht 64.5 in | Wt 176.2 lb

## 2017-06-12 DIAGNOSIS — E890 Postprocedural hypothyroidism: Secondary | ICD-10-CM

## 2017-06-12 DIAGNOSIS — D4959 Neoplasm of unspecified behavior of other genitourinary organ: Secondary | ICD-10-CM | POA: Diagnosis not present

## 2017-06-12 DIAGNOSIS — Z794 Long term (current) use of insulin: Secondary | ICD-10-CM | POA: Diagnosis not present

## 2017-06-12 DIAGNOSIS — E559 Vitamin D deficiency, unspecified: Secondary | ICD-10-CM | POA: Insufficient documentation

## 2017-06-12 NOTE — Progress Notes (Signed)
GYNECOLOGY  VISIT  CC:   Discuss ovary findings on CT  HPI: 72 y.o. G79P0000 Widowed Caucasian female here as new patient for consultation regarding recent CT scan that was obtained at [facility] on 05/30/17 when she went in for chest pain, diarrhea, nausea and vomiting and what felt like a softball sitting in the middle of her chest.  She did have a RUQ ultrasound and CT that day in the ER.  RUQ ultrasound showed fatty liver and the CT showed 3.2 x 1.7cm hypo-attenuating right adnexal lesion that is complicated and/or hemorrhagic.  Pt was aware a "cyst" as seen.  Reviewed findings with her.    She now thinks all of the issues she had when she went to the ER were related to taking doxycycline which was given after having a tick bite.      Denies vaginal bleeding or pelvic pain.    Has a hx of TAH in her early 61's (1977) due to menorrhagia.    GYNECOLOGIC HISTORY: No LMP recorded. Patient is postmenopausal. Contraception: hysterectomy  Menopausal hormone therapy: none  There are no active problems to display for this patient.   Past Medical History:  Diagnosis Date  . Ankle fracture, right 2014  . Diabetes mellitus without complication ([place])   . Hypertension   . Thyroid disease   . Urinary incontinence     Past Surgical History:  Procedure Laterality Date  . ABDOMINAL HYSTERECTOMY  1977  . CYST EXCISION  1962   Spine  . THYROID SURGERY  1986   Thyroid Nodule removal     MEDS:   Current Outpatient Medications on File Prior to Visit  Medication Sig Dispense Refill  . albuterol (PROAIR HFA) 108 (90 Base) MCG/ACT inhaler Inhale 2 puffs into the lungs every 6 (six) hours as needed.    Marland Kitchen amLODipine (NORVASC) 10 MG tablet Take 10 mg by mouth daily.    Marland Kitchen aspirin EC 81 MG tablet Take 81 mg by mouth daily.    . Cholecalciferol (VITAMIN D3) 1000 units CAPS Take 1 capsule by mouth daily.    . empagliflozin (JARDIANCE) 25 MG TABS tablet Take 25 mg by mouth daily.    . fenofibrate 160  MG tablet Take 160 mg by mouth daily.    Marland Kitchen FLUoxetine (PROZAC) 40 MG capsule Take 40 mg by mouth daily.    . Fluticasone-Salmeterol (ADVAIR DISKUS) 100-50 MCG/DOSE AEPB Inhale 1 puff into the lungs daily as needed.     Marland Kitchen glimepiride (AMARYL) 4 MG tablet Take 4 mg by mouth daily.    . Insulin Glargine (LANTUS SOLOSTAR) 100 UNIT/ML Solostar Pen Inject 60 Units into the skin daily.     Marland Kitchen levothyroxine (SYNTHROID, LEVOTHROID) 100 MCG tablet Take 1 tablet by mouth daily.    Marland Kitchen lisinopril-hydrochlorothiazide (PRINZIDE,ZESTORETIC) 20-25 MG tablet Take 1 tablet by mouth daily.    . meclizine (ANTIVERT) 12.5 MG tablet Take 12.5 mg by mouth 3 (three) times daily as needed for dizziness.    . metFORMIN (GLUCOPHAGE-XR) 500 MG 24 hr tablet Take 1 tablet by mouth 2 (two) times daily.  11  . metoprolol succinate (TOPROL-XL) 100 MG 24 hr tablet Take 100 mg by mouth daily. Take with or immediately following a meal.    . MULTIPLE VITAMINS ESSENTIAL PO Take by mouth.    . naphazoline-pheniramine (ALLERGY EYE) 0.025-0.3 % ophthalmic solution Place 1 drop into both eyes 4 (four) times daily as needed for eye irritation.     Marland Kitchen omeprazole (  PRILOSEC) 20 MG capsule Take 20 mg by mouth 2 (two) times daily as needed.     . simvastatin (ZOCOR) 40 MG tablet Take 40 mg by mouth daily.    Marland Kitchen VICTOZA 18 MG/3ML SOPN Inject 18 mg into the skin daily.   3   No current facility-administered medications on file prior to visit.     ALLERGIES: Doxycycline; Codeine; and Sulfa antibiotics  Family History  Problem Relation Age of Onset  . Breast cancer Mother 18  . Cancer Other   . Hypertension Other   . Stroke Other   . Heart disease Other   . Diabetes Other     SH:  Widowed, non smoker  Review of Systems  All other systems reviewed and are negative.   PHYSICAL EXAMINATION:    BP (!) 118/56 (BP Location: Right Arm, Patient Position: Sitting, Cuff Size: Normal)   Pulse 72   Resp 16   Ht 5' 4.5" (1.638 m)   Wt 176 lb  3.2 oz (79.9 kg)   BMI 29.78 kg/m     General appearance: alert, cooperative and appears stated age CV:  Regular rate and rhythm Lungs:  clear to auscultation, no wheezes, rales or rhonchi, symmetric air entry Abdomen: soft, non-tender; bowel sounds normal; no masses,  no organomegaly  Pelvic: External genitalia:  no lesions except for sebaceous cyst on left labia majora              Urethra:  normal appearing urethra with no masses, tenderness or lesions              Bartholins and Skenes: normal                 Vagina: normal appearing vagina with normal color and discharge, no lesions              Cervix: absent              Bimanual Exam:  Uterus:  uterus absent              Adnexa: no mass, fullness, tenderness              Rectovaginal: Yes.  .  Confirms.              Anus:  normal sphincter tone, no lesions  Chaperone was present for exam.  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  Last HbA1c on 06/07/17 was 6.3. Hypertension  Plan: Ca-125 will be obtained and pt will have pelvic ultrasound performed.  May end up needing to remove ovary but would like to have a better idea of what the adnexal mass is before going to the OR, if possible   ~30 minutes spent with patient >50% of time was in face to face discussion of above.

## 2017-06-13 LAB — CA 125: CANCER ANTIGEN (CA) 125: 9.2 U/mL (ref 0.0–38.1)

## 2017-06-15 ENCOUNTER — Ambulatory Visit (INDEPENDENT_AMBULATORY_CARE_PROVIDER_SITE_OTHER): Payer: Medicare Other

## 2017-06-15 ENCOUNTER — Encounter: Payer: Self-pay | Admitting: Obstetrics & Gynecology

## 2017-06-15 ENCOUNTER — Other Ambulatory Visit: Payer: Self-pay

## 2017-06-15 ENCOUNTER — Ambulatory Visit: Payer: Medicare Other | Admitting: Obstetrics & Gynecology

## 2017-06-15 VITALS — BP 110/58 | HR 84 | Resp 12 | Ht 64.5 in | Wt 176.5 lb

## 2017-06-15 DIAGNOSIS — D4959 Neoplasm of unspecified behavior of other genitourinary organ: Secondary | ICD-10-CM

## 2017-06-15 NOTE — Progress Notes (Signed)
72 y.o. G0P0000 Widowed Caucasian female here for pelvic ultrasound due to assess the right ovary and possible complicated cyst or hemorrhage that was noted on CT measuring 3.2 x 1.7cm.  This was performed on 05/2817.  Pt did have a normal pelvic exam on 06/12/17 and her Ca-125 was normal on that day as well.  Denies vaginal bleeding.  H/O TAH.  No LMP recorded. Patient is postmenopausal.  Contraception: TAH  Findings:  UTERUS: surgically absent EMS:  N/A ADNEXA: Left ovary: 2.8 x 1.8 x 1.7cm       Right ovary: 2.7 x 2.0 x 1.6cm.   CUL DE SAC: no free fluid.  Discussion:  Ultrasound images reviewed with pt as well as prior ca-125.  There are no adnexal masses or cysts that are noted.  The ovaries appear normal, PMP, and atrophic.  There are no cysts, masses, or free fluid on or around either ovary.  Advised pt I am not sure what the CT was showing unless it was her own ovary but the ultrasound is completely normal today and her ca-125 is normal.  Offered to repeat ultrasound in 4-6 months to show stability and no change.  Pt does not want this and feels very comfortable with the evaluation done thus far that feel "complete" to her.  She Does not want to return for a repeat PUS unless new symptoms arise.  She will call if this occurs.    Assessment:  Possible abnormal right ovarian cysts (CT read as possible hemorrhage)  Plan:  Pt knows to call with any changes/concerns.  Declined repeat PUS at this time.  Will cc: Vicenta Aly on this report.   ~15 minutes spent with patient >50% of time was in face to face discussion of above.

## 2017-06-16 ENCOUNTER — Other Ambulatory Visit: Payer: Self-pay | Admitting: Nurse Practitioner

## 2017-06-16 DIAGNOSIS — Z1231 Encounter for screening mammogram for malignant neoplasm of breast: Secondary | ICD-10-CM

## 2017-06-21 ENCOUNTER — Ambulatory Visit: Payer: Medicare Other | Admitting: Cardiology

## 2017-06-21 ENCOUNTER — Other Ambulatory Visit: Payer: Self-pay

## 2017-06-21 ENCOUNTER — Encounter: Payer: Self-pay | Admitting: Cardiology

## 2017-06-21 VITALS — BP 130/75 | HR 63 | Ht 64.0 in | Wt 177.0 lb

## 2017-06-21 DIAGNOSIS — R0789 Other chest pain: Secondary | ICD-10-CM

## 2017-06-21 NOTE — Progress Notes (Signed)
Clinical Summary Helen [name] is a 72 y.o.female seen as new consult, referred Helen [name] for chest pain.   1. Chest pain - seen in ER 05/30/17 with chest pain, nausea and vomiting, diarrhea - trop neg x2, EKG SR without ischemic changes. CXR no acute process  - episode occurred at home. Reports recent tick bite, had been started doxycycline. After starting for a few days.  - while in bed awoke from sleep feeling nauseous, weak, clammy, lower abdominal pain. Went to bathroom with diarrhea. Had tightness epigastric/midchest. Chest pain lasted symptoms lasted 10-15 minutes. No recurrence.  - no new exertional symptoms.  - went to ER for evaluation.     Past Medical History:  Diagnosis Date  . Ankle fracture, right 2014  . Diabetes mellitus without complication ([place])   . Hypertension   . Thyroid disease   . Urinary incontinence      Allergies  Allergen Reactions  . Doxycycline Nausea Only    Chest Tightness   . Codeine Nausea And Vomiting  . Sulfa Antibiotics Nausea And Vomiting     Current Outpatient Medications  Medication Sig Dispense Refill  . albuterol (PROAIR HFA) 108 (90 Base) MCG/ACT inhaler Inhale 2 puffs into the lungs every 6 (six) hours as needed.    Marland Kitchen amLODipine (NORVASC) 10 MG tablet Take 10 mg by mouth daily.    Marland Kitchen aspirin EC 81 MG tablet Take 81 mg by mouth daily.    . Cholecalciferol (VITAMIN D3) 1000 units CAPS Take 1 capsule by mouth daily.    . empagliflozin (JARDIANCE) 25 MG TABS tablet Take 25 mg by mouth daily.    . fenofibrate 160 MG tablet Take 160 mg by mouth daily.    Marland Kitchen FLUoxetine (PROZAC) 40 MG capsule Take 40 mg by mouth daily.    . Fluticasone-Salmeterol (ADVAIR DISKUS) 100-50 MCG/DOSE AEPB Inhale 1 puff into the lungs daily as needed.     Marland Kitchen glimepiride (AMARYL) 4 MG tablet Take 4 mg by mouth daily.    . Insulin Glargine (LANTUS SOLOSTAR) 100 UNIT/ML Solostar Pen Inject 60 Units into the skin daily.     Marland Kitchen levothyroxine (SYNTHROID,  LEVOTHROID) 100 MCG tablet Take 1 tablet by mouth daily.    Marland Kitchen lisinopril-hydrochlorothiazide (PRINZIDE,ZESTORETIC) 20-25 MG tablet Take 1 tablet by mouth daily.    . meclizine (ANTIVERT) 12.5 MG tablet Take 12.5 mg by mouth 3 (three) times daily as needed for dizziness.    . metFORMIN (GLUCOPHAGE-XR) 500 MG 24 hr tablet Take 1 tablet by mouth 2 (two) times daily.  11  . metoprolol succinate (TOPROL-XL) 100 MG 24 hr tablet Take 100 mg by mouth daily. Take with or immediately following a meal.    . MULTIPLE VITAMINS ESSENTIAL PO Take by mouth.    . naphazoline-pheniramine (ALLERGY EYE) 0.025-0.3 % ophthalmic solution Place 1 drop into both eyes 4 (four) times daily as needed for eye irritation.     Marland Kitchen omeprazole (PRILOSEC) 20 MG capsule Take 20 mg by mouth 2 (two) times daily as needed.     . simvastatin (ZOCOR) 40 MG tablet Take 40 mg by mouth daily.    Marland Kitchen VICTOZA 18 MG/3ML SOPN Inject 18 mg into the skin daily.   3   No current facility-administered medications for this visit.      Past Surgical History:  Procedure Laterality Date  . ABDOMINAL HYSTERECTOMY  1977   menorrhagia  . CYST EXCISION  1962   Spine  . THYROID SURGERY  1986   Thyroid Nodule removal      Allergies  Allergen Reactions  . Doxycycline Nausea Only    Chest Tightness   . Codeine Nausea And Vomiting  . Sulfa Antibiotics Nausea And Vomiting      Family History  Problem Relation Age of Onset  . Breast cancer Mother 71  . Cancer Other   . Hypertension Other   . Stroke Other   . Heart disease Other   . Diabetes Other      Social History Ms. Brockway reports that she quit smoking about 29 years ago. She has never used smokeless tobacco. Ms. Culton reports that she drank alcohol.   Review of Systems CONSTITUTIONAL: No weight loss, fever, chills, weakness or fatigue.  HEENT: Eyes: No visual loss, blurred vision, double vision or yellow sclerae.No hearing loss, sneezing, congestion, runny nose or sore  throat.  SKIN: No rash or itching.  CARDIOVASCULAR: per hpi RESPIRATORY: per hpi GASTROINTESTINAL: No anorexia, nausea, vomiting or diarrhea. No abdominal pain or blood.  GENITOURINARY: No burning on urination, no polyuria NEUROLOGICAL: No headache, dizziness, syncope, paralysis, ataxia, numbness or tingling in the extremities. No change in bowel or bladder control.  MUSCULOSKELETAL: No muscle, back pain, joint pain or stiffness.  LYMPHATICS: No enlarged nodes. No history of splenectomy.  PSYCHIATRIC: No history of depression or anxiety.  ENDOCRINOLOGIC: No reports of sweating, cold or heat intolerance. No polyuria or polydipsia.  Marland Kitchen   Physical Examination Vitals:   06/21/17 1012  BP: 130/75  Pulse: 63  SpO2: 97%   Vitals:   06/21/17 1012  Weight: 177 lb (80.3 kg)  Height: 5\' 4"  (1.626 m)    Gen: resting comfortably, no acute distress HEENT: no scleral icterus, pupils equal round and reactive, no palptable cervical adenopathy,  CV: RRR, no m/r/g, no jvd Resp: Clear to auscultation bilaterally GI: abdomen is soft, non-tender, non-distended, normal bowel sounds, no hepatosplenomegaly MSK: extremities are warm, no edema.  Skin: warm, no rash Neuro:  no focal deficits Psych: appropriate affect     Assessment and Plan  1. Chest pain - recent symptoms not consistent with cardiac etiology. Occurred in setting of GI illness with nausea and abdominal pain, diarrhea. Developed chest and epigastric pain. No recurrence of chest pain symptoms - no further workup planned at this time. If recurrence of symptoms in absence of GI issues would reconsider at that time.    F/u as needed   Arnoldo Lenis, M.D

## 2017-06-21 NOTE — Patient Instructions (Signed)
Medication Instructions:  Continue all current medications.  Labwork: none  Testing/Procedures: none  Follow-Up: As needed.    Any Other Special Instructions Will Be Listed Below (If Applicable).  If you need a refill on your cardiac medications before your next appointment, please call your pharmacy.  

## 2017-06-29 ENCOUNTER — Encounter: Payer: Self-pay | Admitting: Cardiology

## 2017-07-05 ENCOUNTER — Ambulatory Visit
Admission: RE | Admit: 2017-07-05 | Discharge: 2017-07-05 | Disposition: A | Discharge status: Home / Self Care | Payer: Medicare Other | Source: Ambulatory Visit | Attending: Nurse Practitioner | Admitting: Nurse Practitioner

## 2017-07-05 DIAGNOSIS — Z1231 Encounter for screening mammogram for malignant neoplasm of breast: Secondary | ICD-10-CM

## 2017-11-13 ENCOUNTER — Ambulatory Visit: Payer: Medicare Other | Admitting: Registered"

## 2018-01-17 ENCOUNTER — Other Ambulatory Visit: Payer: Self-pay | Admitting: Nurse Practitioner

## 2018-01-17 DIAGNOSIS — M810 Age-related osteoporosis without current pathological fracture: Secondary | ICD-10-CM

## 2018-02-05 ENCOUNTER — Ambulatory Visit
Admission: RE | Admit: 2018-02-05 | Discharge: 2018-02-05 | Disposition: A | Discharge status: Home / Self Care | Payer: Medicare Other | Source: Ambulatory Visit | Attending: Nurse Practitioner | Admitting: Nurse Practitioner

## 2018-02-05 DIAGNOSIS — M810 Age-related osteoporosis without current pathological fracture: Secondary | ICD-10-CM

## 2018-06-05 IMAGING — CR DG CHEST 1V PORT
1 series · 1 of 1 positions shown · non-contrast
Comparison: 11/03/2006

CLINICAL DATA: Central chest pressure woke patient up around 2
a.m.. Nausea. Currently being treated for Savio Locklear
fever.

EXAM:
PORTABLE CHEST 1 VIEW

[portable]
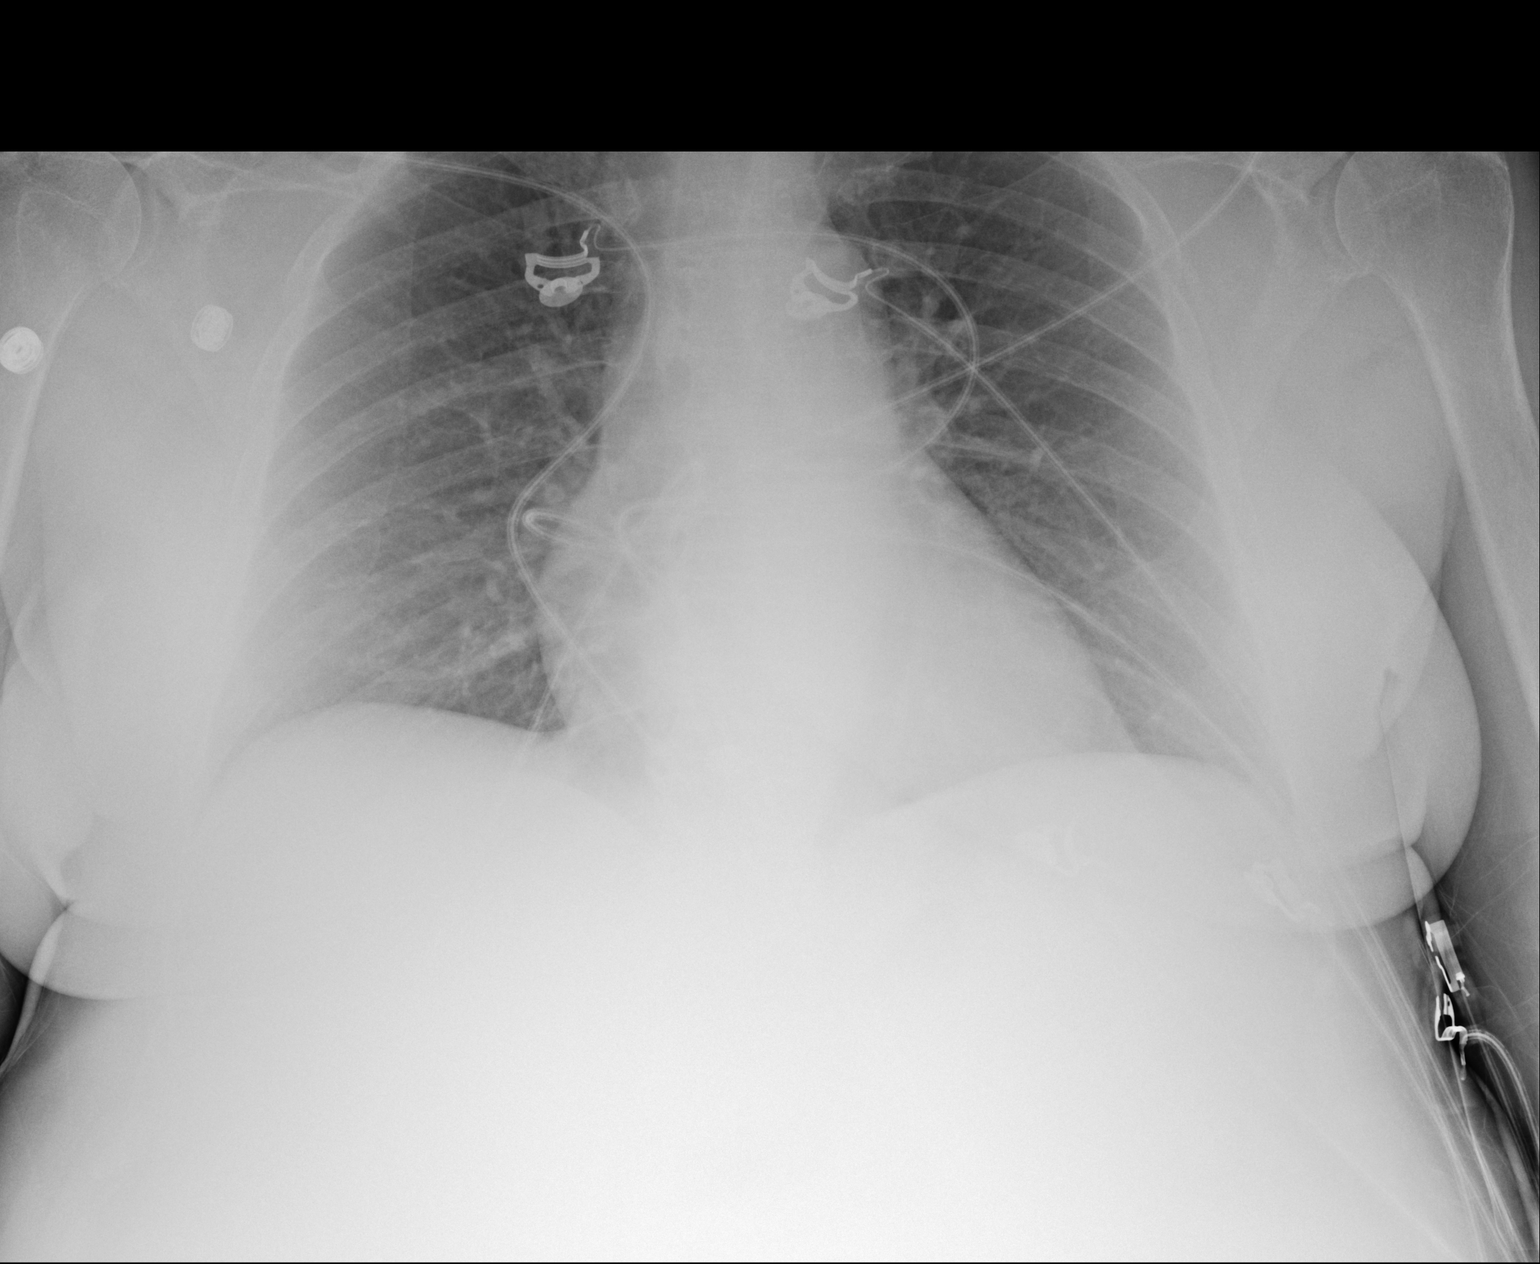

[1 of 1 positions shown; findings below may reference images not displayed]

FINDINGS: The heart size and mediastinal contours are within normal limits.
Both lungs are clear. The visualized skeletal structures are
unremarkable.
IMPRESSION: No active disease.

## 2018-06-05 IMAGING — CT CT ABD-PELV W/ CM
2 of 5 series · 15 of 46 positions shown, 17 images · IV contrast (Isovue)
Comparison: None.

CLINICAL DATA: Epigastric abdominal pain.

EXAM:
CT ABDOMEN AND PELVIS WITH CONTRAST
TECHNIQUE: Multidetector CT imaging of the abdomen and pelvis was performed
using the standard protocol following bolus administration of
intravenous contrast.
CONTRAST:  100mL [id number] IOPAMIDOL ([id number]) INJECTION 61%

[Series 2: axial st · axial · 0.82mm/px · z∈[+976,+1366]mm · 12 of 88 slices shown, 14 images]
[im 5/88  soft-tissue]
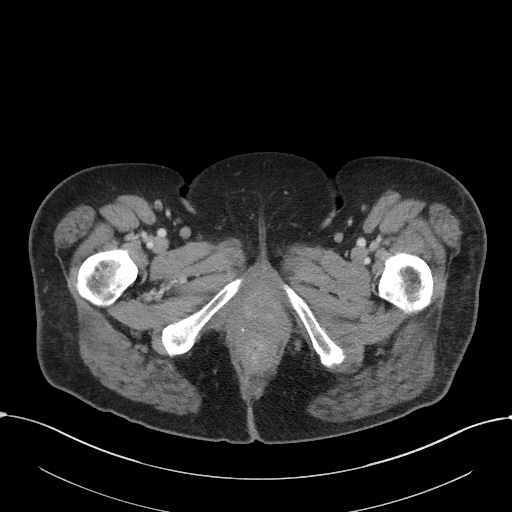
[im 5/88  bone]
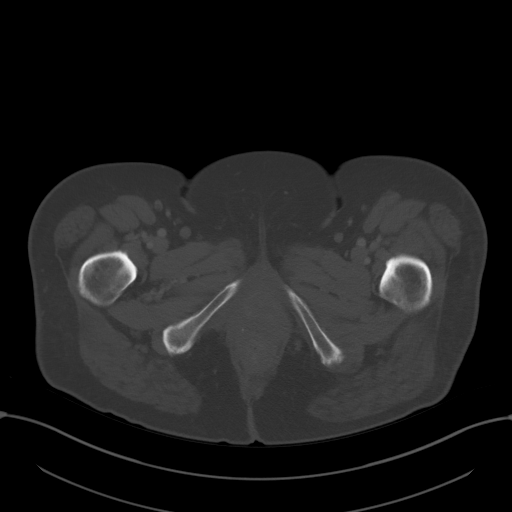
[im 15/88  soft-tissue]
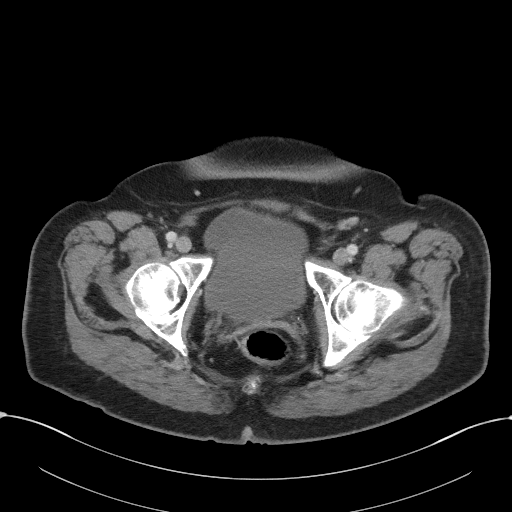
[im 20/88  soft-tissue]
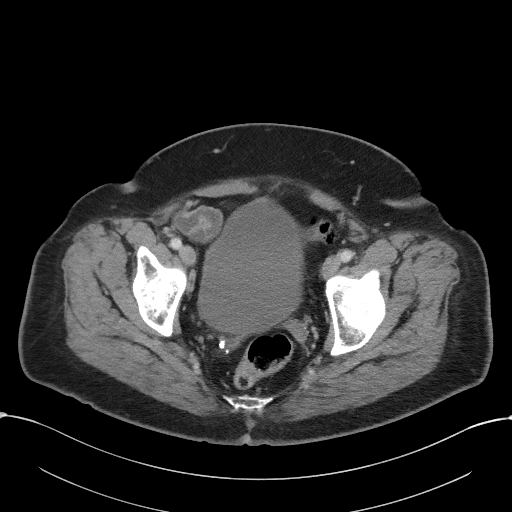
[im 25/88  soft-tissue]
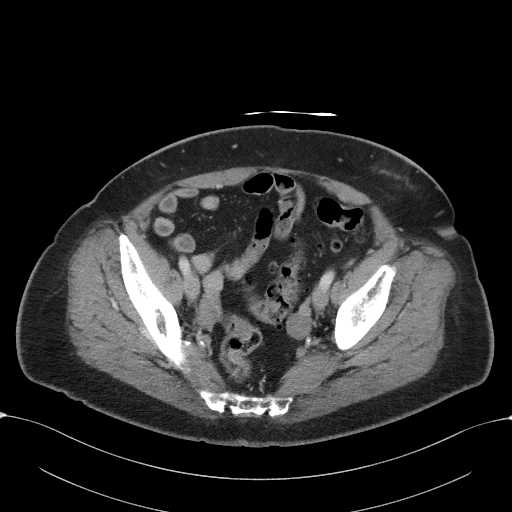
[im 34/88  soft-tissue]
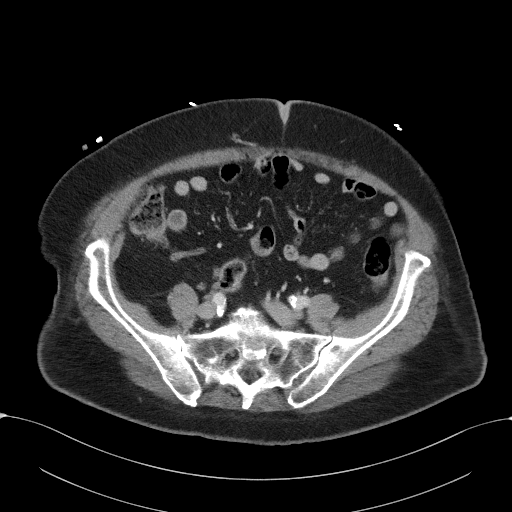
[im 39/88  soft-tissue]
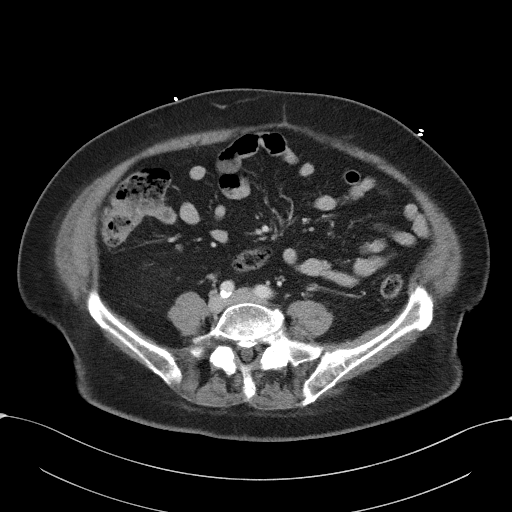
[im 49/88  soft-tissue]
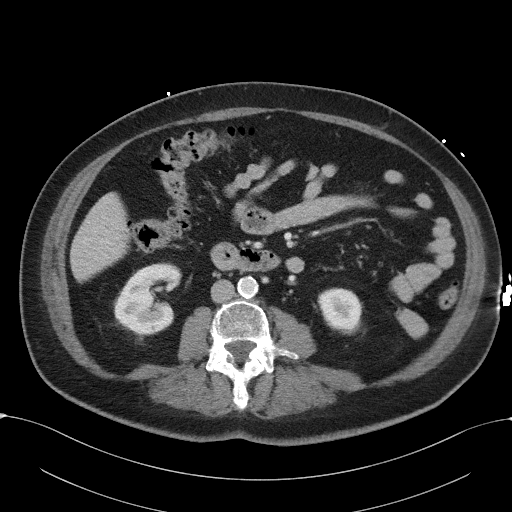
[im 54/88  soft-tissue]
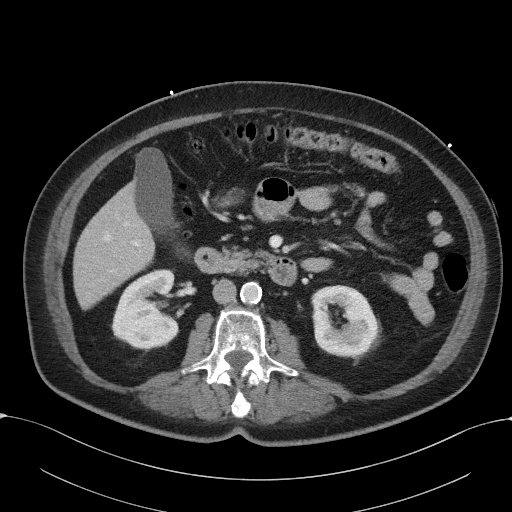
[im 63/88  soft-tissue]
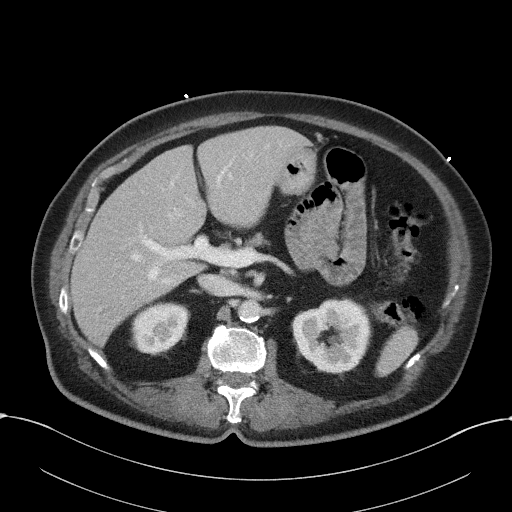
[im 63/88  bone]
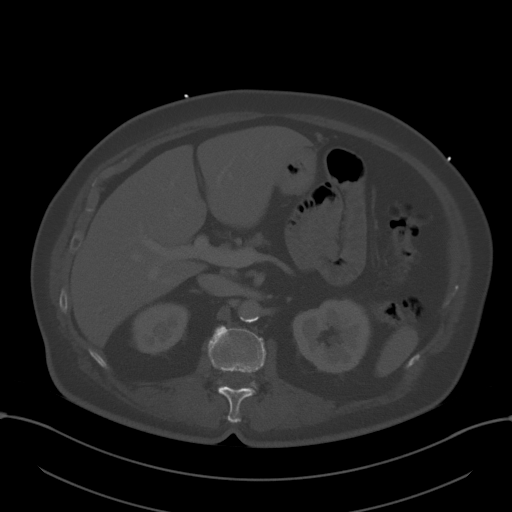
[im 68/88  soft-tissue]
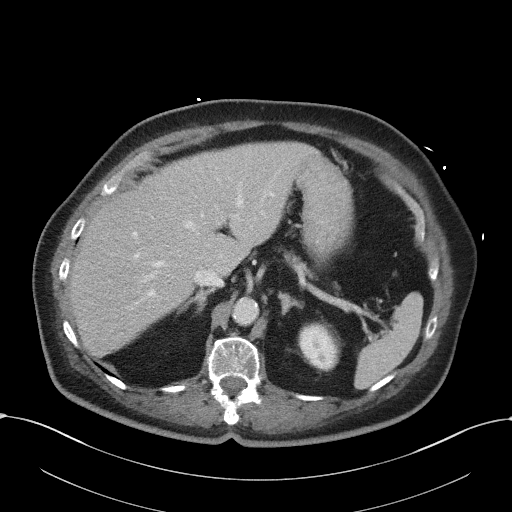
[im 73/88  soft-tissue]
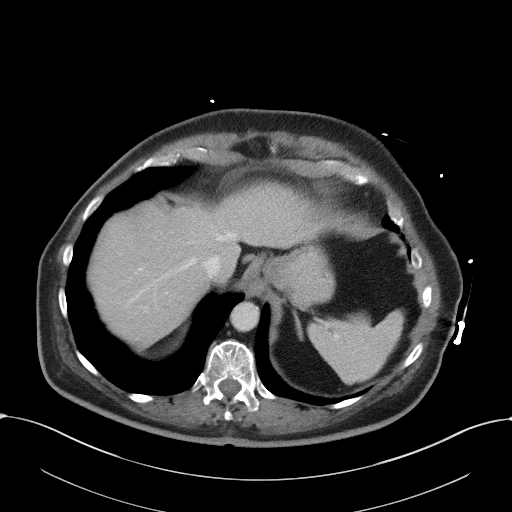
[im 83/88  soft-tissue]
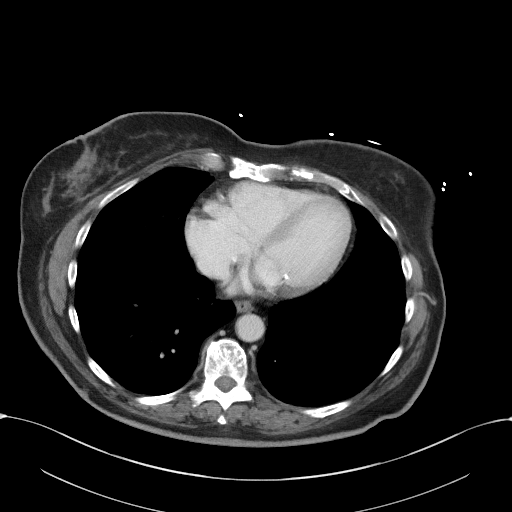

[Series 6: coronal st · coronal · 0.77mm/px · 3 of 116 slices shown]
[im 39/116  soft-tissue]
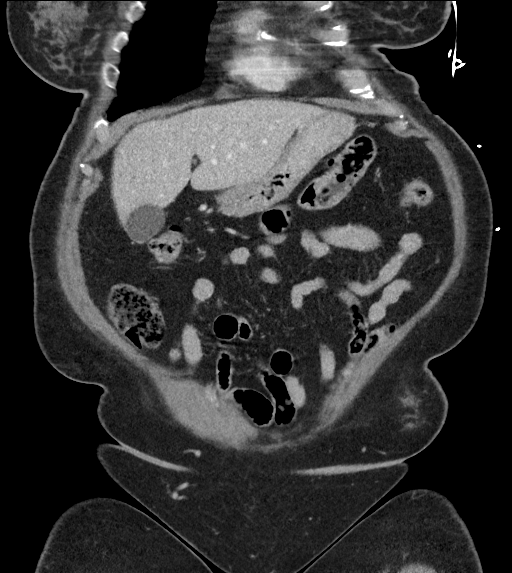
[im 52/116  soft-tissue]
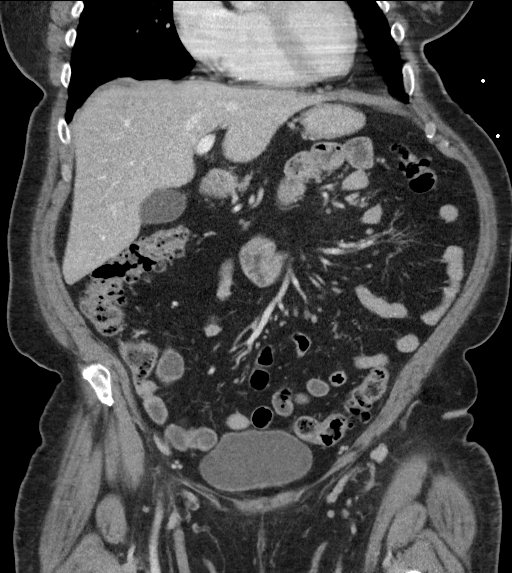
[im 64/116  soft-tissue]
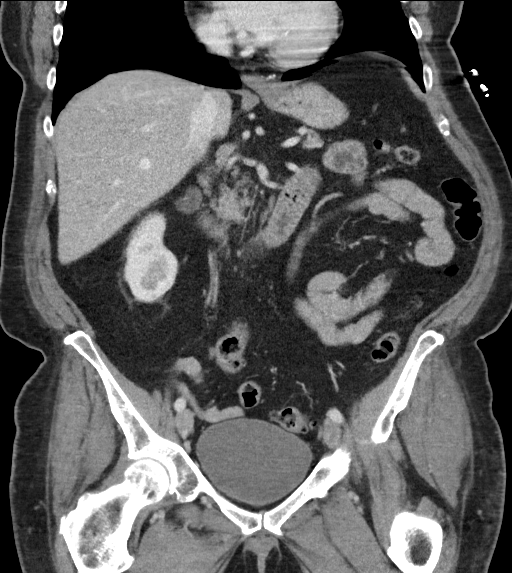

[15 of 46 positions shown; findings below may reference images not displayed]

FINDINGS: Lower chest: Limited visualization of lower thorax demonstrates
minimal subsegmental bibasilar atelectasis. No discrete focal
airspace opacities. No pleural effusion.

Normal heart size. Coronary artery calcifications. Calcifications of
the mitral valve annulus. Trace amount of pericardial fluid,
presumably physiologic.

Hepatobiliary: Normal hepatic contour. No discrete hepatic lesions.
Normal appearance of the gallbladder given degree distention. No
radiopaque gallstones. No intra extrahepatic bili duct dilatation.
No ascites.

Pancreas: Normal appearance of the pancreas

Spleen: Normal appearance of the spleen

Adrenals/Urinary Tract: There is symmetric enhancement and excretion
of the bilateral kidneys. No definite renal stones on this
postcontrast examination bilateral subcentimeter hypoattenuating
renal lesions are too small to adequately characterize though likely
represent renal cysts. There is a minimal amount of grossly
symmetric likely age and body habitus related perinephric stranding.
No urine obstruction.

Normal appearance of the urinary bladder.

Normal appearance the bilateral adrenal glands.

Stomach/Bowel: Scattered minimal colonic diverticulosis without
evidence of diverticulitis. Moderate colonic stool burden without
evidence of enteric obstruction. Normal appearance of the terminal
ileum and appendix. No pneumoperitoneum, pneumatosis or portal
venous gas.

Vascular/Lymphatic: Scattered atherosclerotic plaque within a normal
caliber abdominal aorta. The major branch vessels of the abdominal
aorta appear patent on this non CTA examination. Note is made of
separate origins of the GDA, splenic and left gastric arteries all
arising separately directly from the abdominal aorta in lieu of a
conventional celiac trunk. Note is made of replaced right and left
hepatic artery from the proximal SMA.

No bulky retroperitoneal, mesenteric, pelvic or inguinal
lymphadenopathy.

Reproductive: Post hysterectomy. Note is made of an approximately
3.2 x 1.7 cm hypoattenuating right-sided adnexal lesion. No discrete
left-sided adnexal lesions. No free fluid the pelvic cul-de-sac.

Other: There is a minimal amount of subcutaneous edema about
undersurface of left side of the abdominal pannus.

Musculoskeletal: No acute or aggressive osseous abnormalities.
Stigmata of DISH within the thoracic spine. Moderate multilevel
lumbar spine DDD, worse at L4-L5 and L5-S1 with disc space height
loss, endplate irregularity and small posteriorly directed disc
osteophyte complexes at these locations.
IMPRESSION: 1. No explanation for patient's epigastric abdominal pain and
nausea. Specifically, no evidence of enteric or urinary obstruction.
2. Approximately 3.2 cm hypoattenuating right-sided adnexal lesion,
potentially a complicated/hemorrhagic ovarian cyst, however this is
an atypical finding in a postmenopausal patient. Further evaluation
with nonemergent pelvic ultrasound could be performed as indicated.
3. Aortic Atherosclerosis ([id number]-[id number]).

## 2018-07-11 ENCOUNTER — Other Ambulatory Visit: Payer: Self-pay | Admitting: Nurse Practitioner

## 2018-07-11 DIAGNOSIS — Z1231 Encounter for screening mammogram for malignant neoplasm of breast: Secondary | ICD-10-CM

## 2018-08-22 ENCOUNTER — Ambulatory Visit
Admission: RE | Admit: 2018-08-22 | Discharge: 2018-08-22 | Disposition: A | Discharge status: Home / Self Care | Payer: Medicare Other | Source: Ambulatory Visit | Attending: Nurse Practitioner | Admitting: Nurse Practitioner

## 2018-08-22 ENCOUNTER — Other Ambulatory Visit: Payer: Self-pay

## 2018-08-22 DIAGNOSIS — Z1231 Encounter for screening mammogram for malignant neoplasm of breast: Secondary | ICD-10-CM

## 2018-09-14 ENCOUNTER — Other Ambulatory Visit: Payer: Self-pay | Admitting: Internal Medicine

## 2018-09-14 DIAGNOSIS — E042 Nontoxic multinodular goiter: Secondary | ICD-10-CM

## 2018-09-21 ENCOUNTER — Ambulatory Visit
Admission: RE | Admit: 2018-09-21 | Discharge: 2018-09-21 | Disposition: A | Discharge status: Home / Self Care | Payer: Medicare Other | Source: Ambulatory Visit | Attending: Internal Medicine | Admitting: Internal Medicine

## 2018-09-21 DIAGNOSIS — E042 Nontoxic multinodular goiter: Secondary | ICD-10-CM

## 2019-01-02 ENCOUNTER — Other Ambulatory Visit: Payer: Self-pay

## 2019-01-02 ENCOUNTER — Ambulatory Visit: Payer: Medicare Other | Attending: Orthopaedic Surgery | Admitting: Physical Therapy

## 2019-01-02 DIAGNOSIS — M25512 Pain in left shoulder: Secondary | ICD-10-CM | POA: Diagnosis not present

## 2019-01-02 DIAGNOSIS — M25612 Stiffness of left shoulder, not elsewhere classified: Secondary | ICD-10-CM | POA: Insufficient documentation

## 2019-01-02 NOTE — Therapy (Signed)
[facility] [place], [place], [address] Phone: [phone]   Fax:  [phone]  Physical Therapy Evaluation  Patient Details  Name: Helen [name] MRN: [id number] Date of Birth: [birth year] Referring Provider (PT): Frazier Butt MD   Encounter Date: 01/02/2019  PT End of Session - 01/02/19 1545    Visit Number  1    Number of Visits  12    Date for PT Re-Evaluation  04/02/19    Authorization Type  FOTO.    PT Start Time  0145    PT Stop Time  0230    PT Time Calculation (min)  45 min       Past Medical History:  Diagnosis Date  . Ankle fracture, right 2014  . Diabetes mellitus without complication ([place])   . Hypertension   . Thyroid disease   . Urinary incontinence     Past Surgical History:  Procedure Laterality Date  . ABDOMINAL HYSTERECTOMY  1977   menorrhagia  . CYST EXCISION  1962   Spine  . THYROID SURGERY  1986   Thyroid Nodule removal     There were no vitals filed for this visit.   Subjective Assessment - 01/02/19 1551    Subjective  COVID-19 screen performed prior to patient entering clinic.  The patient presents to the clinic with a diagnosis of a nondisplaced left proximal humeral fracture.  This was the result of a fall on 11/29/18.  She is out of her sling at this time.  She states she was wearing a splint due to a left wrist sprain but this is doing much better and she would like to focus treatment to her left shoulder.  Her left shoulder pain-level is a 6/10 today and increase with motion.  Rest decreases pain.    Pertinent History  Right ankle surgery, thyroid surgery, DM, HTN    Patient Stated Goals  Use left UE without pain.    Currently in Pain?  Yes    Pain Score  6     Pain Location  Shoulder    Pain Orientation  Left    Pain Descriptors / Indicators  Aching    Pain Type  Acute pain    Pain Onset  More than a month ago    Pain Frequency  Constant    Aggravating Factors    See above.    Pain Relieving Factors  See above.         [facility] PT Assessment - 01/02/19 0001      Assessment   Medical Diagnosis  Nondisplaced left proximal humeral fracture.    Referring Provider (PT)  Frazier Butt MD    Onset Date/Surgical Date  --   11/29/18     Precautions   Precautions  --   Begin with gentle left shoulder ROM.     Restrictions   Weight Bearing Restrictions  --   No left UE weight bearing.     Balance Screen   Has the patient fallen in the past 6 months  Yes    How many times?  --   1.   Has the patient had a decrease in activity level because of a fear of falling?   No    Is the patient reluctant to leave their home because of a fear of falling?   No      Home Film/video editor residence      Prior Function   Level  of Independence  Independent      Observation/Other Assessments   Focus on Therapeutic Outcomes (FOTO)   74% limitation.      ROM / Strength   AROM / PROM / Strength  PROM      PROM   Overall PROM Comments  In supine:  PAROM into left shoulder flexion= 80 degrees, ER= 37 degrees and IR to abdomen.      Palpation   Palpation comment  tender to palpation in region of  left deltoid tuberosity.      Ambulation/Gait   Gait Comments  WNL.                Objective measurements completed on examination: See above findings.      [facility] Adult PT Treatment/Exercise - 01/02/19 0001      Modalities   Modalities  Electrical Stimulation;Moist Heat      Moist Heat Therapy   Number Minutes Moist Heat  15 Minutes    Moist Heat Location  --   Left shoulder.     Electrical Stimulation   Electrical Stimulation Location  Left proximal humerus.    Electrical Stimulation Action  Pre-mod.    Electrical Stimulation Parameters  80-150 Hz x 15 minutes.    Electrical Stimulation Goals  Pain               PT Short Term Goals - 01/02/19 1614      PT SHORT TERM GOAL #1   Title  STG's=LTG's.         PT Long Term Goals - 01/02/19 1617      PT LONG TERM GOAL #1   Title  Independent with a HEP.    Time  6    Period  Weeks    Status  New      PT LONG TERM GOAL #2   Title  Active left shoulder flexion to 145 degrees so the patient can easily reach overhead.    Time  6    Period  Weeks    Status  New      PT LONG TERM GOAL #3   Title  Active ER to 70 degrees+ to allow for easily donning/doffing of apparel.    Time  6    Period  Weeks    Status  New      PT LONG TERM GOAL #4   Title  Increase ROM so patient is able to reach behind back to L3 with left hand.    Time  6    Period  Weeks    Status  New      PT LONG TERM GOAL #5   Title  Perform ADL's with left shoulder pain not > 3/10.    Time  6    Period  Weeks    Status  New             Plan - 01/02/19 1609    Clinical Impression Statement  The patient presents to [facility] with a diagnosis of a nondisplaced proximal left humeral fracture due to a fall on 11/29/18.  She presented to the clinic today without a sling.  She has an expected loss of left shoulder range of motion at this time.  Her FOTO limitation score was 74%.  She is tender to palpation in the region of the deltoid tuberosity.Patient will benefit from skilled physical therapy intervention to address deficits and pain.    Personal Factors and Comorbidities  Comorbidity 1;Comorbidity 2  Comorbidities  Right ankle surgery, thyroid surgery, DM, HTN    Examination-Activity Limitations  Reach Overhead;Other    Examination-Participation Restrictions  Cleaning;Other    Stability/Clinical Decision Making  Stable/Uncomplicated    Clinical Decision Making  Low    Rehab Potential  Excellent    PT Frequency  2x / week    PT Duration  6 weeks    PT Treatment/Interventions  ADLs/Self Care Home Management;Cryotherapy;Electrical Stimulation;Moist Heat;Iontophoresis 4mg /ml Dexamethasone;Therapeutic activities;Therapeutic exercise;Manual techniques;Patient/family  education;Passive range of motion    PT Next Visit Plan  Supine cane exercises, P and AAROM, begin with seated UE ranger and then progress to standing.  HMP and e'stim.    Consulted and Agree with Plan of Care  Patient       Patient will benefit from skilled therapeutic intervention in order to improve the following deficits and impairments:  Decreased activity tolerance, Decreased range of motion, Pain  Visit Diagnosis: Acute pain of left shoulder - Plan: PT plan of care cert/re-cert  Stiffness of left shoulder, not elsewhere classified - Plan: PT plan of care cert/re-cert     Problem List Patient Active Problem List   Diagnosis Date Noted  . Vitamin D deficiency 06/12/2017  . Type II diabetes mellitus, well controlled ([place]) 09/14/2016  . Long-term insulin use ([place]) 06/26/2013  . Hypothyroidism 04/15/2010  . Hyperlipidemia LDL goal <100 04/15/2010  . Essential hypertension 04/15/2010  . Osteoarthrosis involving upper arm 07/20/2007    Helen [name], [place] MPT 01/02/2019, 4:20 PM  [facility] [place], [place], [address] Phone: [phone]   Fax:  [phone]  Name: Helen [name] MRN: [id number] Date of Birth: 1945/05/07

## 2019-01-08 ENCOUNTER — Ambulatory Visit: Payer: Medicare Other | Attending: Orthopaedic Surgery | Admitting: Physical Therapy

## 2019-01-08 ENCOUNTER — Other Ambulatory Visit: Payer: Self-pay

## 2019-01-08 DIAGNOSIS — M25612 Stiffness of left shoulder, not elsewhere classified: Secondary | ICD-10-CM | POA: Insufficient documentation

## 2019-01-08 DIAGNOSIS — M25512 Pain in left shoulder: Secondary | ICD-10-CM | POA: Insufficient documentation

## 2019-01-08 NOTE — Therapy (Signed)
[facility] [place], [place], [address] Phone: [phone]   Fax:  [phone]  Physical Therapy Treatment  Patient Details  Name: Helen [name] MRN: [id number] Date of Birth: 1945/09/16 Referring Provider (PT): Frazier Butt MD   Encounter Date: 01/08/2019  PT End of Session - 01/08/19 1218    Visit Number  2    Number of Visits  12    Date for PT Re-Evaluation  04/02/19    Authorization Type  FOTO.    PT Start Time  1030    PT Stop Time  1124    PT Time Calculation (min)  54 min    Activity Tolerance  Patient tolerated treatment well    Behavior During Therapy  WFL for tasks assessed/performed       Past Medical History:  Diagnosis Date  . Ankle fracture, right 2014  . Diabetes mellitus without complication ([place])   . Hypertension   . Thyroid disease   . Urinary incontinence     Past Surgical History:  Procedure Laterality Date  . ABDOMINAL HYSTERECTOMY  1977   menorrhagia  . CYST EXCISION  1962   Spine  . THYROID SURGERY  1986   Thyroid Nodule removal     There were no vitals filed for this visit.  Subjective Assessment - 01/08/19 1216    Subjective  COVID-19 screen performed prior to patient entering clinic.  Been doing the exercises.    Pertinent History  Right ankle surgery, thyroid surgery, DM, HTN    Patient Stated Goals  Use left UE without pain.    Currently in Pain?  Yes    Pain Score  5     Pain Location  Shoulder    Pain Orientation  Left    Pain Descriptors / Indicators  Aching    Pain Type  Acute pain    Pain Onset  More than a month ago                       [facility] Adult PT Treatment/Exercise - 01/08/19 0001      Exercises   Exercises  Shoulder      Shoulder Exercises: Seated   Other Seated Exercises  Seated UE ranger x 5 minutes.      Shoulder Exercises: Pulleys   Other Pulley Exercises  Active-assistive shoulder flexion x 5 minutes.      Modalities    Modalities  Electrical Stimulation;Moist Heat      Moist Heat Therapy   Number Minutes Moist Heat  20 Minutes    Moist Heat Location  --   Left shoulder.     Acupuncturist Stimulation Location  Left affected shoulder.    Electrical Stimulation Action  IFC (non-motoric).    Electrical Stimulation Parameters  80-150 Hz x 20 minutes @ 100% scan.    Electrical Stimulation Goals  Pain      Manual Therapy   Manual Therapy  Passive ROM    Passive ROM  In supine:  Gentle P and PAROM x 16 minutes to patient's left shoulder into flexion and ER.               PT Short Term Goals - 01/02/19 1614      PT SHORT TERM GOAL #1   Title  STG's=LTG's.        PT Long Term Goals - 01/02/19 1617      PT LONG TERM GOAL #1  Title  Independent with a HEP.    Time  6    Period  Weeks    Status  New      PT LONG TERM GOAL #2   Title  Active left shoulder flexion to 145 degrees so the patient can easily reach overhead.    Time  6    Period  Weeks    Status  New      PT LONG TERM GOAL #3   Title  Active ER to 70 degrees+ to allow for easily donning/doffing of apparel.    Time  6    Period  Weeks    Status  New      PT LONG TERM GOAL #4   Title  Increase ROM so patient is able to reach behind back to L3 with left hand.    Time  6    Period  Weeks    Status  New      PT LONG TERM GOAL #5   Title  Perform ADL's with left shoulder pain not > 3/10.    Time  6    Period  Weeks    Status  New            Plan - 01/08/19 1222    Clinical Impression Statement  The patient did very well today and she has been compliant to her HEP.  Added active-assistive left shoulder flexion using her right hand to grasp her left wrist.    Personal Factors and Comorbidities  Comorbidity 1;Comorbidity 2    Comorbidities  Right ankle surgery, thyroid surgery, DM, HTN    Examination-Activity Limitations  Reach Overhead;Other    Examination-Participation Restrictions   Cleaning;Other    Stability/Clinical Decision Making  Stable/Uncomplicated    Rehab Potential  Excellent    PT Frequency  2x / week    PT Duration  6 weeks    PT Treatment/Interventions  ADLs/Self Care Home Management;Cryotherapy;Electrical Stimulation;Moist Heat;Iontophoresis 4mg /ml Dexamethasone;Therapeutic activities;Therapeutic exercise;Manual techniques;Patient/family education;Passive range of motion    PT Next Visit Plan  Supine cane exercises, P and AAROM, begin with seated UE ranger and then progress to standing.  HMP and e'stim.    Consulted and Agree with Plan of Care  Patient       Patient will benefit from skilled therapeutic intervention in order to improve the following deficits and impairments:  Decreased activity tolerance, Decreased range of motion, Pain  Visit Diagnosis: Acute pain of left shoulder  Stiffness of left shoulder, not elsewhere classified     Problem List Patient Active Problem List   Diagnosis Date Noted  . Vitamin D deficiency 06/12/2017  . Type II diabetes mellitus, well controlled ([place]) 09/14/2016  . Long-term insulin use ([place]) 06/26/2013  . Hypothyroidism 04/15/2010  . Hyperlipidemia LDL goal <100 04/15/2010  . Essential hypertension 04/15/2010  . Osteoarthrosis involving upper arm 07/20/2007    Mansa Willers, [place] MPT 01/08/2019, 12:24 PM  [facility] [address] [place], [place], [address] Phone: [phone]   Fax:  [phone]  Name: Helen [name] MRN: [id number] Date of Birth: [birth year]

## 2019-01-14 ENCOUNTER — Other Ambulatory Visit: Payer: Self-pay

## 2019-01-14 ENCOUNTER — Ambulatory Visit: Payer: Medicare Other | Admitting: Physical Therapy

## 2019-01-14 DIAGNOSIS — M25512 Pain in left shoulder: Secondary | ICD-10-CM

## 2019-01-14 DIAGNOSIS — M25612 Stiffness of left shoulder, not elsewhere classified: Secondary | ICD-10-CM

## 2019-01-14 NOTE — Therapy (Signed)
[facility] [place], [place], [address] Phone: [phone]   Fax:  [phone]  Physical Therapy Treatment  Patient Details  Name: Helen [name] MRN: [id number] Date of Birth: 10/02/1945 Referring Provider (PT): Frazier Butt MD   Encounter Date: 01/14/2019  PT End of Session - 01/14/19 1558    Visit Number  3    Number of Visits  12    Date for PT Re-Evaluation  04/02/19    Authorization Type  FOTO.    PT Start Time  0308    PT Stop Time  0406    PT Time Calculation (min)  58 min    Activity Tolerance  Patient tolerated treatment well    Behavior During Therapy  WFL for tasks assessed/performed       Past Medical History:  Diagnosis Date  . Ankle fracture, right 2014  . Diabetes mellitus without complication ([place])   . Hypertension   . Thyroid disease   . Urinary incontinence     Past Surgical History:  Procedure Laterality Date  . ABDOMINAL HYSTERECTOMY  1977   menorrhagia  . CYST EXCISION  1962   Spine  . THYROID SURGERY  1986   Thyroid Nodule removal     There were no vitals filed for this visit.  Subjective Assessment - 01/14/19 1545    Subjective  COVID-19 screen performed prior to patient entering clinic.  Doing good.    Pertinent History  Right ankle surgery, thyroid surgery, DM, HTN    Patient Stated Goals  Use left UE without pain.    Currently in Pain?  Yes    Pain Score  5     Pain Location  Shoulder    Pain Orientation  Left    Pain Descriptors / Indicators  Aching    Pain Type  Acute pain    Pain Onset  More than a month ago                       [facility] Adult PT Treatment/Exercise - 01/14/19 0001      Exercises   Exercises  Shoulder      Shoulder Exercises: Seated   Other Seated Exercises  Seated UE Ranger x 4 minutes.      Shoulder Exercises: Pulleys   Flexion  --   4 minutes.     Modalities   Modalities  Electrical Stimulation;Moist Heat      Moist Heat Therapy   Number Minutes Moist Heat  20 Minutes    Moist Heat Location  --   Left shoulder.     Acupuncturist Stimulation Location  Left affected shoulder.    Electrical Stimulation Action  IFC x 20 minutes at 80-150 Hz    Electrical Stimulation Goals  Pain      Manual Therapy   Manual Therapy  Passive ROM    Passive ROM  In supine:  P and PAROM x 20 minutes into flexion, ER/IR               PT Short Term Goals - 01/02/19 1614      PT SHORT TERM GOAL #1   Title  STG's=LTG's.        PT Long Term Goals - 01/02/19 1617      PT LONG TERM GOAL #1   Title  Independent with a HEP.    Time  6    Period  Weeks  Status  New      PT LONG TERM GOAL #2   Title  Active left shoulder flexion to 145 degrees so the patient can easily reach overhead.    Time  6    Period  Weeks    Status  New      PT LONG TERM GOAL #3   Title  Active ER to 70 degrees+ to allow for easily donning/doffing of apparel.    Time  6    Period  Weeks    Status  New      PT LONG TERM GOAL #4   Title  Increase ROM so patient is able to reach behind back to L3 with left hand.    Time  6    Period  Weeks    Status  New      PT LONG TERM GOAL #5   Title  Perform ADL's with left shoulder pain not > 3/10.    Time  6    Period  Weeks    Status  New            Plan - 01/14/19 1555    Clinical Impression Statement  The patient has been complinat to her HEP.  Her left shoulder ER and flexion has already improved significantly.    Personal Factors and Comorbidities  Comorbidity 1;Comorbidity 2    Comorbidities  Right ankle surgery, thyroid surgery, DM, HTN    Examination-Activity Limitations  Reach Overhead;Other    Examination-Participation Restrictions  Cleaning;Other    Stability/Clinical Decision Making  Stable/Uncomplicated    Rehab Potential  Excellent    PT Frequency  2x / week    PT Duration  6 weeks    PT Treatment/Interventions  ADLs/Self Care  Home Management;Cryotherapy;Electrical Stimulation;Moist Heat;Iontophoresis 4mg /ml Dexamethasone;Therapeutic activities;Therapeutic exercise;Manual techniques;Patient/family education;Passive range of motion    PT Next Visit Plan  Supine cane exercises, P and AAROM, begin with seated UE ranger and then progress to standing.  HMP and e'stim.    Consulted and Agree with Plan of Care  Patient       Patient will benefit from skilled therapeutic intervention in order to improve the following deficits and impairments:  Decreased activity tolerance, Decreased range of motion, Pain  Visit Diagnosis: Acute pain of left shoulder  Stiffness of left shoulder, not elsewhere classified     Problem List Patient Active Problem List   Diagnosis Date Noted  . Vitamin D deficiency 06/12/2017  . Type II diabetes mellitus, well controlled ([place]) 09/14/2016  . Long-term insulin use ([place]) 06/26/2013  . Hypothyroidism 04/15/2010  . Hyperlipidemia LDL goal <100 04/15/2010  . Essential hypertension 04/15/2010  . Osteoarthrosis involving upper arm 07/20/2007    , [place]  MPT 01/14/2019, 4:15 PM  [facility] [place], [place], [address] Phone: [phone]   Fax:  [phone]  Name: EVLYN EDRIS MRN: [id number] Date of Birth: 12-29-1945

## 2019-01-15 ENCOUNTER — Ambulatory Visit: Payer: Medicare Other | Admitting: Physical Therapy

## 2019-01-23 ENCOUNTER — Ambulatory Visit: Payer: Medicare Other | Admitting: Physical Therapy

## 2019-01-24 ENCOUNTER — Ambulatory Visit: Payer: Medicare Other | Admitting: Physical Therapy

## 2019-01-25 ENCOUNTER — Ambulatory Visit: Payer: Medicare Other | Admitting: Physical Therapy

## 2019-01-25 ENCOUNTER — Encounter: Payer: Self-pay | Admitting: Physical Therapy

## 2019-01-25 ENCOUNTER — Other Ambulatory Visit: Payer: Self-pay

## 2019-01-25 DIAGNOSIS — M25512 Pain in left shoulder: Secondary | ICD-10-CM

## 2019-01-25 DIAGNOSIS — M25612 Stiffness of left shoulder, not elsewhere classified: Secondary | ICD-10-CM

## 2019-01-25 NOTE — Therapy (Addendum)
[facility] [place], [place], [address] Phone: [phone]   Fax:  [phone]  Physical Therapy Treatment  Patient Details  Name: Helen [name] MRN: [id number] Date of Birth: 1945/04/06 Referring Provider (PT): Frazier Butt MD   Encounter Date: 01/25/2019  PT End of Session - 01/25/19 0740     Visit Number  4    Number of Visits  12    Date for PT Re-Evaluation  04/02/19    Authorization Type  FOTO.    PT Start Time  [phone]    PT Stop Time  0820    PT Time Calculation (min)  47 min    Activity Tolerance  Patient tolerated treatment well    Behavior During Therapy  WFL for tasks assessed/performed        Past Medical History:  Diagnosis Date   Ankle fracture, right 2014   Diabetes mellitus without complication ([place])    Hypertension    Thyroid disease    Urinary incontinence     Past Surgical History:  Procedure Laterality Date   ABDOMINAL HYSTERECTOMY  1977   menorrhagia   CYST EXCISION  1962   Spine   THYROID SURGERY  1986   Thyroid Nodule removal     There were no vitals filed for this visit.  Subjective Assessment - 01/25/19 0734     Subjective  COVID-19 screen performed prior to patient entering clinic.  Doing good. No constant pain.    Pertinent History  Right ankle surgery, thyroid surgery, DM, HTN    Patient Stated Goals  Use left UE without pain.    Currently in Pain?  Yes    Pain Score  5     Pain Location  Shoulder    Pain Orientation  Left    Pain Descriptors / Indicators  Discomfort    Pain Type  Acute pain    Pain Onset  More than a month ago    Pain Frequency  Intermittent          [facility] PT Assessment - 01/25/19 0001       Assessment   Medical Diagnosis  Nondisplaced left proximal humeral fracture.    Referring Provider (PT)  Frazier Butt MD    Onset Date/Surgical Date  11/29/18    Next MD Visit  01/29/2019                    [facility] Adult PT  Treatment/Exercise - 01/25/19 0001       Shoulder Exercises: Pulleys   Flexion  5 minutes      Shoulder Exercises: ROM/Strengthening   Ranger  UE ranger in sitting flex, CW and CCW circles x5 min      Modalities   Modalities  Electrical Stimulation;Moist Heat      Moist Heat Therapy   Number Minutes Moist Heat  15 Minutes    Moist Heat Location  Shoulder      Electrical Stimulation   Electrical Stimulation Location  Left affected shoulder.    Electrical Stimulation Action  Pre-Mod    Electrical Stimulation Parameters  80-150 hz x15 min    Electrical Stimulation Goals  Pain      Manual Therapy   Manual Therapy  Passive ROM    Passive ROM  PROM of L shoulder into flexion, ER, IR with gentle holds at end range                PT Short Term Goals -  01/02/19 1614       PT SHORT TERM GOAL #1   Title  STG's=LTG's.         PT Long Term Goals - 01/02/19 1617       PT LONG TERM GOAL #1   Title  Independent with a HEP.    Time  6    Period  Weeks    Status  New      PT LONG TERM GOAL #2   Title  Active left shoulder flexion to 145 degrees so the patient can easily reach overhead.    Time  6    Period  Weeks    Status  New      PT LONG TERM GOAL #3   Title  Active ER to 70 degrees+ to allow for easily donning/doffing of apparel.    Time  6    Period  Weeks    Status  New      PT LONG TERM GOAL #4   Title  Increase ROM so patient is able to reach behind back to L3 with left hand.    Time  6    Period  Weeks    Status  New      PT LONG TERM GOAL #5   Title  Perform ADL's with left shoulder pain not > 3/10.    Time  6    Period  Weeks    Status  New             Plan - 01/25/19 8264     Clinical Impression Statement  Patient presented in clinic with intermittant L shoulder pain. Patient guided through light AAROM exercises with no reports of any discomfort. Firm end feels and smooth arc of motion noted throughout PROM session of L shoulder.  Normal modalities response noted following removal of the modalities.    Personal Factors and Comorbidities  Comorbidity 1;Comorbidity 2    Comorbidities  Right ankle surgery, thyroid surgery, DM, HTN    Examination-Activity Limitations  Reach Overhead;Other    Examination-Participation Restrictions  Cleaning;Other    Stability/Clinical Decision Making  Stable/Uncomplicated    Rehab Potential  Excellent    PT Frequency  2x / week    PT Duration  6 weeks    PT Treatment/Interventions  ADLs/Self Care Home Management;Cryotherapy;Electrical Stimulation;Moist Heat;Iontophoresis '4mg'$ /ml Dexamethasone;Therapeutic activities;Therapeutic exercise;Manual techniques;Patient/family education;Passive range of motion    PT Next Visit Plan  Supine cane exercises, P and AAROM, begin with seated UE ranger and then progress to standing.  HMP and e'stim.    Consulted and Agree with Plan of Care  Patient        Patient will benefit from skilled therapeutic intervention in order to improve the following deficits and impairments:  Decreased activity tolerance, Decreased range of motion, Pain  Visit Diagnosis: Acute pain of left shoulder  Stiffness of left shoulder, not elsewhere classified     Problem List Patient Active Problem List   Diagnosis Date Noted   Vitamin D deficiency 06/12/2017   Type II diabetes mellitus, well controlled ([place]) 09/14/2016   Long-term insulin use ([place]) 06/26/2013   Hypothyroidism 04/15/2010   Hyperlipidemia LDL goal <100 04/15/2010   Essential hypertension 04/15/2010   Osteoarthrosis involving upper arm 07/20/2007    Standley Brooking, PTA 01/25/2019, 8:26 AM  [facility] [place], [place], [address] Phone: [phone]   Fax:  [phone]  Name: Helen [name] MRN: [id number] Date of Birth: January 15, 1945  PHYSICAL THERAPY DISCHARGE SUMMARY  Visits from Start of Care: 4.  Current functional level related to  goals / functional outcomes: See above.   Remaining deficits: See below.   Education / Equipment: HEP.   Patient agrees to discharge. Patient goals were not met. Patient is being discharged due to not returning since the last visit.     Mali Applegate MPT

## 2019-03-08 ENCOUNTER — Ambulatory Visit: Payer: Medicare Other | Attending: Internal Medicine

## 2019-03-08 DIAGNOSIS — Z23 Encounter for immunization: Secondary | ICD-10-CM | POA: Insufficient documentation

## 2019-03-08 NOTE — Progress Notes (Signed)
   Covid-19 Vaccination Clinic  Name:  Helen [name]    MRN: [id number] DOB: 1945-12-14  03/08/2019  [name] was observed post Covid-19 immunization for 15 minutes without incident. She was provided with Vaccine Information Sheet and instruction to access the V-Safe system.   [name] was instructed to call 911 with any severe reactions post vaccine: Marland Kitchen Difficulty breathing  . Swelling of face and throat  . A fast heartbeat  . A bad rash all over body  . Dizziness and weakness   Immunizations Administered    Name Date Dose VIS Date Route   Moderna COVID-19 Vaccine 03/08/2019  8:50 AM 0.5 mL 12/04/2018 Intramuscular   Manufacturer: Moderna   Lot: [id number]   [id number]

## 2019-04-10 ENCOUNTER — Ambulatory Visit: Payer: Medicare Other | Attending: Internal Medicine

## 2019-04-10 DIAGNOSIS — Z23 Encounter for immunization: Secondary | ICD-10-CM

## 2019-04-10 NOTE — Progress Notes (Signed)
   Covid-19 Vaccination Clinic  Name:  Helen [name]    MRN: [id number] DOB: 06/19/1945  04/10/2019  [name] was observed post Covid-19 immunization for 15 minutes without incident. She was provided with Vaccine Information Sheet and instruction to access the V-Safe system.   [name] was instructed to call 911 with any severe reactions post vaccine: Marland Kitchen Difficulty breathing  . Swelling of face and throat  . A fast heartbeat  . A bad rash all over body  . Dizziness and weakness   Immunizations Administered    Name Date Dose VIS Date Route   Moderna COVID-19 Vaccine 04/10/2019 10:06 AM 0.5 mL 12/04/2018 Intramuscular   Manufacturer: Levan Hurst   LotUD:[id number]   [place]:[id number]

## 2019-09-18 ENCOUNTER — Other Ambulatory Visit: Payer: Self-pay | Admitting: Pediatrics

## 2019-09-18 DIAGNOSIS — Z1231 Encounter for screening mammogram for malignant neoplasm of breast: Secondary | ICD-10-CM

## 2019-09-27 ENCOUNTER — Ambulatory Visit
Admission: RE | Admit: 2019-09-27 | Discharge: 2019-09-27 | Disposition: A | Discharge status: Home / Self Care | Payer: Medicare Other | Source: Ambulatory Visit | Attending: Pediatrics | Admitting: Pediatrics

## 2019-09-27 ENCOUNTER — Other Ambulatory Visit: Payer: Self-pay

## 2019-09-27 DIAGNOSIS — Z1231 Encounter for screening mammogram for malignant neoplasm of breast: Secondary | ICD-10-CM

## 2020-01-24 ENCOUNTER — Emergency Department (HOSPITAL_COMMUNITY): Payer: Medicare Other

## 2020-01-24 ENCOUNTER — Other Ambulatory Visit: Payer: Self-pay

## 2020-01-24 ENCOUNTER — Emergency Department (HOSPITAL_COMMUNITY)
Admission: EM | Admit: 2020-01-24 | Discharge: 2020-01-25 | Disposition: A | Discharge status: Home / Self Care | Payer: Medicare Other | Attending: Emergency Medicine | Admitting: Emergency Medicine

## 2020-01-24 ENCOUNTER — Encounter (HOSPITAL_COMMUNITY): Payer: Self-pay | Admitting: Emergency Medicine

## 2020-01-24 DIAGNOSIS — S32020A Wedge compression fracture of second lumbar vertebra, initial encounter for closed fracture: Secondary | ICD-10-CM | POA: Diagnosis not present

## 2020-01-24 DIAGNOSIS — Z7984 Long term (current) use of oral hypoglycemic drugs: Secondary | ICD-10-CM | POA: Diagnosis not present

## 2020-01-24 DIAGNOSIS — Z794 Long term (current) use of insulin: Secondary | ICD-10-CM | POA: Diagnosis not present

## 2020-01-24 DIAGNOSIS — S3992XA Unspecified injury of lower back, initial encounter: Secondary | ICD-10-CM | POA: Diagnosis present

## 2020-01-24 DIAGNOSIS — W000XXA Fall on same level due to ice and snow, initial encounter: Secondary | ICD-10-CM | POA: Diagnosis not present

## 2020-01-24 DIAGNOSIS — Z79899 Other long term (current) drug therapy: Secondary | ICD-10-CM | POA: Insufficient documentation

## 2020-01-24 DIAGNOSIS — Z7982 Long term (current) use of aspirin: Secondary | ICD-10-CM | POA: Insufficient documentation

## 2020-01-24 DIAGNOSIS — E039 Hypothyroidism, unspecified: Secondary | ICD-10-CM | POA: Diagnosis not present

## 2020-01-24 DIAGNOSIS — I10 Essential (primary) hypertension: Secondary | ICD-10-CM | POA: Diagnosis not present

## 2020-01-24 DIAGNOSIS — E119 Type 2 diabetes mellitus without complications: Secondary | ICD-10-CM | POA: Diagnosis not present

## 2020-01-24 DIAGNOSIS — Z87891 Personal history of nicotine dependence: Secondary | ICD-10-CM | POA: Insufficient documentation

## 2020-01-24 DIAGNOSIS — S32010A Wedge compression fracture of first lumbar vertebra, initial encounter for closed fracture: Secondary | ICD-10-CM | POA: Diagnosis not present

## 2020-01-24 HISTORY — DX: Unspecified fracture of shaft of humerus, left arm, initial encounter for closed fracture: S42.302A

## 2020-01-24 MED ORDER — ONDANSETRON 4 MG PO TBDP
4.0000 mg | ORAL_TABLET | Freq: Once | ORAL | Status: AC
  Administered 2020-01-24: 4 mg via ORAL
  Filled 2020-01-24: qty 1

## 2020-01-24 MED ORDER — HYDROCODONE-ACETAMINOPHEN 5-325 MG PO TABS
1.0000 | ORAL_TABLET | Freq: Once | ORAL | Status: AC
Start: 2020-01-24 — End: 2020-01-24
  Administered 2020-01-24: 1 via ORAL
  Filled 2020-01-24: qty 1

## 2020-01-24 NOTE — ED Provider Notes (Signed)
[facility] EMERGENCY DEPARTMENT Provider Note   CSN: [id number] Arrival date & time: 01/24/20  1725     History Chief Complaint  Patient presents with  . Back Pain    Helen [name] is a 75 y.o. female who presents for evaluation of back injury.  Patient slipped on the ice 3 days ago, fell backward onto a block of ice and had some immediate severe pain.  She is unable to get off of the ground however the gentleman who helps her with her yard was here and was able to lift her.  She has had pretty severe pain since that time.  She states the pain is worse every time she moves, it is bilateral but a little bit worse on the right and radiates into the right hip.  She denies any numbness tingling or weakness of the lower extremities, loss of bowel or bladder control.  She has been taking Tylenol, Motrin without relief.  She saw her chiropractor who was unable to help her pain and also took some muscle relaxers prescribed by her physician.  Her pain is severe and has not improved.  She came here for seeking further evaluation.  HPI     Past Medical History:  Diagnosis Date  . Ankle fracture, right 2014  . Closed left arm fracture   . Diabetes mellitus without complication ([place])   . Hypertension   . Thyroid disease   . Urinary incontinence     Patient Active Problem List   Diagnosis Date Noted  . Vitamin D deficiency 06/12/2017  . Type II diabetes mellitus, well controlled ([place]) 09/14/2016  . Long-term insulin use ([place]) 06/26/2013  . Hypothyroidism 04/15/2010  . Hyperlipidemia LDL goal <100 04/15/2010  . Essential hypertension 04/15/2010  . Osteoarthrosis involving upper arm 07/20/2007    Past Surgical History:  Procedure Laterality Date  . ABDOMINAL HYSTERECTOMY  1977   menorrhagia  . CYST EXCISION  1962   Spine  . THYROID SURGERY  1986   Thyroid Nodule removal      OB History    Gravida  0   Para  0   Term  0   Preterm  0   AB  0   Living  0     SAB  0    IAB  0   Ectopic  0   Multiple  0   Live Births  0           Family History  Problem Relation Age of Onset  . Breast cancer Mother 52  . Cancer Other   . Hypertension Other   . Stroke Other   . Heart disease Other   . Diabetes Other     Social History   Tobacco Use  . Smoking status: Former Smoker    Quit date: 01/04/1988    Years since quitting: 32.0  . Smokeless tobacco: Never Used  Vaping Use  . Vaping Use: Never used  Substance Use Topics  . Alcohol use: Not Currently  . Drug use: Never    Home Medications Prior to Admission medications   Medication Sig Start Date End Date Taking? Authorizing Provider  albuterol (PROAIR HFA) 108 (90 Base) MCG/ACT inhaler Inhale 2 puffs into the lungs every 6 (six) hours as needed. 12/31/14   [provider]  amLODipine (NORVASC) 10 MG tablet Take 10 mg by mouth daily.    [provider]  aspirin EC 81 MG tablet Take 81 mg by mouth daily.  [provider]  Cholecalciferol (VITAMIN D3) 1000 units CAPS Take 1 capsule by mouth daily.    [provider]  empagliflozin (JARDIANCE) 25 MG TABS tablet Take 25 mg by mouth daily.    [provider]  fenofibrate 160 MG tablet Take 160 mg by mouth daily.    [provider]  FLUoxetine (PROZAC) 40 MG capsule Take 40 mg by mouth daily.    [provider]  Fluticasone-Salmeterol (ADVAIR DISKUS) 100-50 MCG/DOSE AEPB Inhale 1 puff into the lungs daily as needed.  04/27/12   [provider]  glimepiride (AMARYL) 4 MG tablet Take 4 mg by mouth daily.    [provider]  Insulin Glargine (LANTUS SOLOSTAR) 100 UNIT/ML Solostar Pen Inject 60 Units into the skin daily.  06/22/16   [provider]  levothyroxine (SYNTHROID, LEVOTHROID) 100 MCG tablet Take 1 tablet by mouth daily. 05/24/17   [provider]  lisinopril-hydrochlorothiazide (PRINZIDE,ZESTORETIC) 20-25 MG tablet Take 1 tablet by mouth daily.     [provider]  meclizine (ANTIVERT) 12.5 MG tablet Take 12.5 mg by mouth 3 (three) times daily as needed for dizziness.    [provider]  metFORMIN (GLUCOPHAGE-XR) 500 MG 24 hr tablet Take 1 tablet by mouth 2 (two) times daily. 05/24/17   [provider]  metoprolol succinate (TOPROL-XL) 100 MG 24 hr tablet Take 100 mg by mouth daily. Take with or immediately following a meal.    [provider]  MULTIPLE VITAMINS ESSENTIAL PO Take by mouth.    [provider]  naphazoline-pheniramine (ALLERGY EYE) 0.025-0.3 % ophthalmic solution Place 1 drop into both eyes 4 (four) times daily as needed for eye irritation.     [provider]  omeprazole (PRILOSEC) 20 MG capsule Take 20 mg by mouth 2 (two) times daily as needed.     [provider]  simvastatin (ZOCOR) 40 MG tablet Take 40 mg by mouth daily.    [provider]  VICTOZA 18 MG/3ML SOPN Inject 18 mg into the skin daily.  05/21/17   [provider]    Allergies    Doxycycline, Codeine, and Sulfa antibiotics  Review of Systems   Review of Systems Ten systems reviewed and are negative for acute change, except as noted in the HPI.   Physical Exam Updated Vital Signs BP (!) 143/66 (BP Location: Right Arm)   Pulse 82   Temp 98.1 F (36.7 C) (Oral)   Resp 16   Ht 5\' 4"  (1.626 m)   Wt 78 kg   SpO2 96%   BMI 29.52 kg/m   Physical Exam Vitals and nursing note reviewed.  Constitutional:      General: She is not in acute distress.    Appearance: She is well-developed and well-nourished. She is not diaphoretic.  HENT:     Head: Normocephalic and atraumatic.  Eyes:     General: No scleral icterus.    Conjunctiva/sclera: Conjunctivae normal.  Cardiovascular:     Rate and Rhythm: Normal rate and regular rhythm.     Heart sounds: Normal heart sounds. No murmur heard. No friction rub. No gallop.   Pulmonary:     Effort: Pulmonary effort is normal. No  respiratory distress.     Breath sounds: Normal breath sounds.  Abdominal:     General: Bowel sounds are normal. There is no distension.     Palpations: Abdomen is soft. There is no mass.     Tenderness: There is no abdominal  tenderness. There is no guarding.  Musculoskeletal:     Cervical back: Normal range of motion.       Back:     Comments: Point tenderness in the midline spine over L1-L2.  Range of motion limited due to pain.  Skin:    General: Skin is warm and dry.  Neurological:     Mental Status: She is alert and oriented to person, place, and time.  Psychiatric:        Behavior: Behavior normal.     ED Results / Procedures / Treatments   Labs (all labs ordered are listed, but only abnormal results are displayed) Labs Reviewed - No data to display  EKG None  Radiology DG Thoracic Spine 2 View  Result Date: 01/24/2020 CLINICAL DATA:  Fall, back pain EXAM: THORACIC SPINE 2 VIEWS COMPARISON:  CT abdomen pelvis 05/30/2017 FINDINGS: There is normal thoracic kyphosis. No acute fracture or listhesis of the thoracic spine. Vertebral body height has been preserved. There is intervertebral disc space narrowing and endplate remodeling involving the mid and lower thoracic spine in keeping with changes of mild degenerative disc disease. The paraspinal soft tissues are unremarkable. Incidentally noted is a superior endplate fracture of L2 with approximately 4-5 mm retrolisthesis of L1 upon L2. There are extensive endplate changes noted at this level suggesting that this may be a chronic process, however, this is not well assessed on this examination. IMPRESSION: No acute fracture or listhesis of the thoracic spine. Age indeterminate superior endplate fracture of L2 with 4-5 mm retrolisthesis L1-L2. Electronically Signed   By: Helyn Numbers MD   On: 01/24/2020 22:53   DG Lumbar Spine Complete  Result Date: 01/24/2020 CLINICAL DATA:  Fall, back pain EXAM: LUMBAR SPINE - COMPLETE 4+ VIEW  COMPARISON:  None. FINDINGS: Five view radiograph lumbar spine. There is a lucency which appears to extend through the body and pedicle of L1 suspicious for an acute chance fracture. Mild compression deformity of L1 is superimposed with approximately 25% loss of height. There is 4-5 mm retrolisthesis of L1 upon L2. The spinal canal appears widely patent and there is no evidence of retropulsion. Superior endplate fracture of L2 is present with approximately 10-15% loss of height. This is age indeterminate. Extensive endplate remodeling at this level suggests that this may be subacute to chronic in nature. Superimposed moderate degenerative disc disease at this level. Remaining vertebral body height has been preserved. There is diffuse intervertebral disc space narrowing throughout the visualized thoracolumbar spine in keeping with changes of mild degenerative disc disease diffusely. Atherosclerotic calcification is seen within the abdominal aorta anterior to the lumbar spine. The paraspinal soft tissues are unremarkable; no definite paravertebral soft tissue swelling identified. IMPRESSION: Possible acute Chance fracture of the L1 vertebral body. Superimposed compression deformity with approximately 25% loss of height. CT examination is recommended for further evaluation. Suspected remote L2 superior endplate fracture with approximately 10-15% loss of height. Diffuse degenerative disc disease throughout the visualized thoracolumbar spine, most severe at L1-2. These results were called by telephone at the time of interpretation on 01/24/2020 at 10:59 pm to provider Anglia Blakley , who verbally acknowledged these results. Electronically Signed   By: Helyn Numbers MD   On: 01/24/2020 23:02    Procedures Procedures (including critical care time)  Medications Ordered in ED Medications  HYDROcodone-acetaminophen (NORCO/VICODIN) 5-325 MG per tablet 1 tablet (has no administration in time range)  ondansetron  (ZOFRAN-ODT) disintegrating tablet 4 mg (has no administration in time range)  ED Course  I have reviewed the triage vital signs and the nursing notes.  Pertinent labs & imaging results that were available during my care of the patient were reviewed by me and considered in my medical decision making (see chart for details).    MDM Rules/Calculators/A&P                          75 year old female here for evaluation of back injury.  She is neurologically intact and has been able to ambulate at home.  I ordered and reviewed plain films of the lumbar and thoracic spine.  I was called by radiology as there is concern for potential Chance fracture at L1-L2.  Patient will require follow-up CT lumbar spine.  Her pain is well controlled at this time.  I have given signout to Dr. Sedonia Small who will assume care of the patient for appropriate disposition. Final Clinical Impression(s) / ED Diagnoses Final diagnoses:  None    Rx / DC Orders ED Discharge Orders    None       Margarita Mail, PA-C 01/25/20 1402    Truddie Hidden, MD 01/25/20 1640

## 2020-01-24 NOTE — ED Triage Notes (Signed)
Pt to the ED with complaints of a fall on Tuesday of this week resulting in back pain. The pt has been seen by a chiropractor several times this week with no improvement.  The pt call her PCP and was prescribed an muscle relaxer with no improvement.

## 2020-01-25 MED ORDER — HYDROCODONE-ACETAMINOPHEN 5-325 MG PO TABS: 1.0000 | ORAL_TABLET | ORAL | 0 refills | Status: AC | PRN

## 2020-01-25 NOTE — ED Provider Notes (Signed)
  Provider Note MRN:  [id number]  Arrival date & time: 01/25/20    ED Course and Medical Decision Making  Assumed care from PA Harris at shift change.  Fell 2 days ago in the snow, x-ray with concern for Chance fracture, will follow-up CT and likely consult neurosurgery.  Patient is neurologically intact.  Ambulating.  Compression fractures of L1 and L2 seen on CT, no significant retropulsion.  These findings discussed with PA Costella of neurosurgery, given that patient is neurologically intact and pain relatively well controlled, she is appropriate for TLSO and outpatient follow-up.  Awaiting transport home.  Procedures  Final Clinical Impressions(s) / ED Diagnoses     ICD-10-CM   1. Compression fracture of L1 vertebra, initial encounter ([place])  S32.010A   2. Closed compression fracture of L2 vertebra, initial encounter [facility])  S32.020A     ED Discharge Orders         Ordered    HYDROcodone-acetaminophen (NORCO/VICODIN) 5-325 MG tablet  Every 4 hours PRN        01/25/20 0258            Discharge Instructions     You were evaluated in the Emergency Department and after careful evaluation, we did not find any emergent condition requiring admission or further testing in the hospital.  Your imaging showed compression fractures of your lumbar spine.  Please wear the brace and follow-up with the spine experts.  We recommend Tylenol or Motrin at home for discomfort.  You can use the Vicodin as needed for more significant pain.  Please return to the Emergency Department if you experience any worsening of your condition.  Thank you for allowing [place] to be a part of your care.      Barth Kirks. Sedonia Small, [place] [email or web address]    Maudie Flakes, MD 01/25/20 [phone]

## 2020-01-25 NOTE — ED Notes (Signed)
TLSO brace placed on pt by ortho- pt is sleeping here tonight per Dr Sedonia Small ok,  as the roads are iced and pt has no transport home Pt resting comfortably at this time.

## 2020-01-25 NOTE — Discharge Instructions (Signed)
You were evaluated in the Emergency Department and after careful evaluation, we did not find any emergent condition requiring admission or further testing in the hospital.  Your imaging showed compression fractures of your lumbar spine.  Please wear the brace and follow-up with the spine experts.  We recommend Tylenol or Motrin at home for discomfort.  You can use the Vicodin as needed for more significant pain.  Please return to the Emergency Department if you experience any worsening of your condition.  Thank you for allowing [place] to be a part of your care.

## 2020-01-25 NOTE — ED Notes (Signed)
[facility] about  TLSO Brace

## 2020-10-23 ENCOUNTER — Other Ambulatory Visit: Payer: Self-pay | Admitting: Nurse Practitioner

## 2020-10-23 ENCOUNTER — Other Ambulatory Visit: Payer: Self-pay | Admitting: Pediatrics

## 2020-10-23 DIAGNOSIS — Z1231 Encounter for screening mammogram for malignant neoplasm of breast: Secondary | ICD-10-CM

## 2020-12-01 ENCOUNTER — Ambulatory Visit
Admission: RE | Admit: 2020-12-01 | Discharge: 2020-12-01 | Disposition: A | Discharge status: Home / Self Care | Payer: Medicare Other | Source: Ambulatory Visit | Attending: Nurse Practitioner | Admitting: Nurse Practitioner

## 2020-12-01 DIAGNOSIS — Z1231 Encounter for screening mammogram for malignant neoplasm of breast: Secondary | ICD-10-CM

## 2020-12-02 ENCOUNTER — Other Ambulatory Visit: Payer: Self-pay | Admitting: Nurse Practitioner

## 2020-12-02 DIAGNOSIS — R928 Other abnormal and inconclusive findings on diagnostic imaging of breast: Secondary | ICD-10-CM

## 2020-12-17 ENCOUNTER — Ambulatory Visit
Admission: RE | Admit: 2020-12-17 | Discharge: 2020-12-17 | Disposition: A | Discharge status: Home / Self Care | Payer: Medicare Other | Source: Ambulatory Visit | Attending: Nurse Practitioner | Admitting: Nurse Practitioner

## 2020-12-17 DIAGNOSIS — R928 Other abnormal and inconclusive findings on diagnostic imaging of breast: Secondary | ICD-10-CM

## 2021-01-07 ENCOUNTER — Other Ambulatory Visit: Payer: Medicare Other

## 2021-02-02 ENCOUNTER — Other Ambulatory Visit: Payer: Self-pay | Admitting: Nurse Practitioner

## 2021-02-02 DIAGNOSIS — Z780 Asymptomatic menopausal state: Secondary | ICD-10-CM

## 2021-07-20 ENCOUNTER — Ambulatory Visit
Admission: RE | Admit: 2021-07-20 | Discharge: 2021-07-20 | Disposition: A | Discharge status: Home / Self Care | Payer: Medicare Other | Source: Ambulatory Visit | Attending: Nurse Practitioner | Admitting: Nurse Practitioner

## 2021-07-20 DIAGNOSIS — Z780 Asymptomatic menopausal state: Secondary | ICD-10-CM

## 2021-11-15 ENCOUNTER — Other Ambulatory Visit: Payer: Self-pay | Admitting: Nurse Practitioner

## 2021-11-15 DIAGNOSIS — Z1231 Encounter for screening mammogram for malignant neoplasm of breast: Secondary | ICD-10-CM

## 2021-12-20 ENCOUNTER — Ambulatory Visit
Admission: RE | Admit: 2021-12-20 | Discharge: 2021-12-20 | Disposition: A | Discharge status: Home / Self Care | Payer: Medicare Other | Source: Ambulatory Visit | Attending: Nurse Practitioner | Admitting: Nurse Practitioner

## 2021-12-20 DIAGNOSIS — Z1231 Encounter for screening mammogram for malignant neoplasm of breast: Secondary | ICD-10-CM

## 2022-03-10 ENCOUNTER — Other Ambulatory Visit (HOSPITAL_COMMUNITY): Payer: Self-pay | Admitting: Nurse Practitioner

## 2022-03-10 DIAGNOSIS — E0590 Thyrotoxicosis, unspecified without thyrotoxic crisis or storm: Secondary | ICD-10-CM

## 2022-03-23 ENCOUNTER — Encounter (HOSPITAL_COMMUNITY)
Admission: RE | Admit: 2022-03-23 | Discharge: 2022-03-23 | Disposition: A | Discharge status: Home / Self Care | Payer: Medicare Other | Source: Ambulatory Visit | Attending: Nurse Practitioner | Admitting: Nurse Practitioner

## 2022-03-23 ENCOUNTER — Ambulatory Visit (HOSPITAL_COMMUNITY): Payer: Medicare Other

## 2022-03-23 DIAGNOSIS — E0590 Thyrotoxicosis, unspecified without thyrotoxic crisis or storm: Secondary | ICD-10-CM | POA: Diagnosis present

## 2022-03-23 MED ORDER — SODIUM IODIDE I-123 7.4 MBQ CAPS
437.0000 | ORAL_CAPSULE | Freq: Once | ORAL | Status: AC
  Administered 2022-03-23: 437 via ORAL

## 2022-03-24 ENCOUNTER — Other Ambulatory Visit (HOSPITAL_COMMUNITY): Payer: Medicare Other

## 2022-03-24 ENCOUNTER — Encounter (HOSPITAL_COMMUNITY)
Admission: RE | Admit: 2022-03-24 | Discharge: 2022-03-24 | Disposition: A | Discharge status: Home / Self Care | Payer: Medicare Other | Source: Ambulatory Visit | Attending: Nurse Practitioner | Admitting: Nurse Practitioner

## 2023-06-01 ENCOUNTER — Encounter: Payer: Self-pay | Admitting: Obstetrics & Gynecology

## 2023-06-01 ENCOUNTER — Ambulatory Visit: Admitting: Obstetrics & Gynecology

## 2023-06-01 VITALS — BP 133/68 | Ht 64.0 in | Wt 156.0 lb

## 2023-06-01 DIAGNOSIS — N3281 Overactive bladder: Secondary | ICD-10-CM

## 2023-06-01 DIAGNOSIS — N952 Postmenopausal atrophic vaginitis: Secondary | ICD-10-CM

## 2023-06-01 DIAGNOSIS — N3941 Urge incontinence: Secondary | ICD-10-CM | POA: Diagnosis not present

## 2023-06-01 DIAGNOSIS — N763 Subacute and chronic vulvitis: Secondary | ICD-10-CM | POA: Diagnosis not present

## 2023-06-01 MED ORDER — ESTRADIOL 10 MCG VA TABS: 1.0000 | ORAL_TABLET | Freq: Every evening | VAGINAL | 0 refills | Status: AC

## 2023-06-01 MED ORDER — MIRABEGRON ER 50 MG PO TB24: 50.0000 mg | ORAL_TABLET | Freq: Every day | ORAL | 11 refills | Status: AC

## 2023-06-01 MED ORDER — SOLIFENACIN SUCCINATE 10 MG PO TABS: 10.0000 mg | ORAL_TABLET | Freq: Every day | ORAL | 11 refills | Status: AC

## 2023-06-01 MED ORDER — ESTRADIOL 10 MCG VA TABS: 1.0000 | ORAL_TABLET | VAGINAL | 11 refills | Status: AC

## 2023-06-01 NOTE — Progress Notes (Signed)
 Chief Complaint  Patient presents with   Medication Consultation    Overactive Bladder      78 y.o. G0P0000 No LMP recorded. Patient is postmenopausal. The current method of family planning is na.  Outpatient Encounter Medications as of 06/01/2023  Medication Sig   albuterol (PROAIR HFA) 108 (90 Base) MCG/ACT inhaler Inhale 2 puffs into the lungs every 6 (six) hours as needed.   amLODipine (NORVASC) 10 MG tablet Take 10 mg by mouth daily.   Cholecalciferol (VITAMIN D3) 1000 units CAPS Take 1 capsule by mouth daily.   empagliflozin (JARDIANCE) 25 MG TABS tablet Take 25 mg by mouth daily.   Estradiol  (YUVAFEM ) 10 MCG TABS vaginal tablet Place 1 tablet (10 mcg total) vaginally at bedtime. For 14 days.   Estradiol  (YUVAFEM ) 10 MCG TABS vaginal tablet Place 1 tablet (10 mcg total) vaginally 2 (two) times a week. At bedtime   fenofibrate 160 MG tablet Take 160 mg by mouth daily.   FLUoxetine (PROZAC) 40 MG capsule Take 40 mg by mouth daily.   Insulin Glargine (LANTUS SOLOSTAR) 100 UNIT/ML Solostar Pen Inject 60 Units into the skin daily.    insulin glargine (LANTUS) 100 UNIT/ML injection Inject 67 Units into the skin daily.   meclizine (ANTIVERT) 12.5 MG tablet Take 12.5 mg by mouth 3 (three) times daily as needed for dizziness.   methimazole (TAPAZOLE) 5 MG tablet Take 5 mg by mouth daily.   metoprolol succinate (TOPROL-XL) 100 MG 24 hr tablet Take 100 mg by mouth daily. Take with or immediately following a meal.   mirabegron  ER (MYRBETRIQ ) 50 MG TB24 tablet Take 1 tablet (50 mg total) by mouth daily. At bedtime   MULTIPLE VITAMINS ESSENTIAL PO Take by mouth.   naphazoline-pheniramine (ALLERGY EYE) 0.025-0.3 % ophthalmic solution Place 1 drop into both eyes 4 (four) times daily as needed for eye irritation.    omeprazole (PRILOSEC) 20 MG capsule Take 20 mg by mouth 2 (two) times daily as needed.    OZEMPIC, 2 MG/DOSE, 8 MG/3ML SOPN INJECT SUBCUTANEOUSLY 2 MG EVERY WEEK   simvastatin  (ZOCOR) 40 MG tablet Take 40 mg by mouth daily.   solifenacin  (VESICARE ) 10 MG tablet Take 1 tablet (10 mg total) by mouth daily.   aspirin EC 81 MG tablet Take 81 mg by mouth daily. (Patient not taking: Reported on 06/01/2023)   Fluticasone-Salmeterol (ADVAIR DISKUS) 100-50 MCG/DOSE AEPB Inhale 1 puff into the lungs daily as needed.  (Patient not taking: Reported on 06/01/2023)   glimepiride (AMARYL) 4 MG tablet Take 4 mg by mouth daily. (Patient not taking: Reported on 06/01/2023)   HYDROcodone -acetaminophen  (NORCO/VICODIN) 5-325 MG tablet Take 1 tablet by mouth every 4 (four) hours as needed. (Patient not taking: Reported on 06/01/2023)   levothyroxine (SYNTHROID, LEVOTHROID) 100 MCG tablet Take 1 tablet by mouth daily. (Patient not taking: Reported on 06/01/2023)   lisinopril-hydrochlorothiazide (PRINZIDE,ZESTORETIC) 20-25 MG tablet Take 1 tablet by mouth daily. (Patient not taking: Reported on 06/01/2023)   metFORMIN (GLUCOPHAGE-XR) 500 MG 24 hr tablet Take 1 tablet by mouth 2 (two) times daily. (Patient not taking: Reported on 06/01/2023)   VICTOZA 18 MG/3ML SOPN Inject 18 mg into the skin daily.  (Patient not taking: Reported on 06/01/2023)   No facility-administered encounter medications on file as of 06/01/2023.    Subjective Pt wants to be evaluated for OAB Stays wet Constant urgency  Past Medical History:  Diagnosis Date   Ankle fracture, right 2014   Closed  left arm fracture    Diabetes mellitus without complication (HCC)    Hypertension    Thyroid  disease    Urinary incontinence     Past Surgical History:  Procedure Laterality Date   ABDOMINAL HYSTERECTOMY  1977   menorrhagia   CYST EXCISION  1962   Spine   THYROID  SURGERY  1986   Thyroid  Nodule removal     OB History     Gravida  0   Para  0   Term  0   Preterm  0   AB  0   Living  0      SAB  0   IAB  0   Ectopic  0   Multiple  0   Live Births  0           Allergies  Allergen Reactions    Doxycycline Nausea Only    Chest Tightness    Codeine Nausea And Vomiting   Sulfa Antibiotics Nausea And Vomiting    Social History   Socioeconomic History   Marital status: Widowed    Spouse name: Not on file   Number of children: Not on file   Years of education: Not on file   Highest education level: Not on file  Occupational History   Not on file  Tobacco Use   Smoking status: Former    Current packs/day: 0.00    Types: Cigarettes    Quit date: 01/04/1988    Years since quitting: 35.4   Smokeless tobacco: Never  Vaping Use   Vaping status: Never Used  Substance and Sexual Activity   Alcohol use: Not Currently   Drug use: Never   Sexual activity: Not Currently    Birth control/protection: Surgical  Other Topics Concern   Not on file  Social History Narrative   Not on file   Social Drivers of Health   Financial Resource Strain: Low Risk  (01/27/2023)   Received [facility]   Overall Financial Resource Strain (CARDIA)    Difficulty of Paying Living Expenses: Not hard at all  Food Insecurity: No Food Insecurity (01/27/2023)   Received from [facility]   Hunger Vital Sign    Worried About Running Out of Food in the Last Year: Never true    Ran Out of Food in the Last Year: Never true  Transportation Needs: No Transportation Needs (01/27/2023)   Received from [facility] - Transportation    Lack of Transportation (Medical): No    Lack of Transportation (Non-Medical): No  Physical Activity: Unknown (07/27/2022)   Received from [facility]   Exercise Vital Sign    Days of Exercise per Week: 0 days    Minutes of Exercise per Session: Not on file  Stress: No Stress Concern Present (07/22/2021)   Received from [facility] - Occupational Stress Questionnaire    Feeling of Stress : Not at all  Social Connections: Socially Integrated (07/22/2021)   Received from [facility]    Social Network    How would you rate your social network (family, work, friends)?: Good participation with social networks    Family History  Problem Relation Age of Onset   Breast cancer Mother 72   Cancer Other    Hypertension Other    Stroke Other    Heart disease Other    Diabetes Other     Medications:  Current Outpatient Medications:    albuterol (PROAIR HFA) 108 (90 Base) MCG/ACT inhaler, Inhale 2 puffs into the lungs every 6 (six) hours as needed., Disp: , Rfl:    amLODipine (NORVASC) 10 MG tablet, Take 10 mg by mouth daily., Disp: , Rfl:    Cholecalciferol (VITAMIN D3) 1000 units CAPS, Take 1 capsule by mouth daily., Disp: , Rfl:    empagliflozin (JARDIANCE) 25 MG TABS tablet, Take 25 mg by mouth daily., Disp: , Rfl:    Estradiol  (YUVAFEM ) 10 MCG TABS vaginal tablet, Place 1 tablet (10 mcg total) vaginally at bedtime. For 14 days., Disp: 14 tablet, Rfl: 0   Estradiol  (YUVAFEM ) 10 MCG TABS vaginal tablet, Place 1 tablet (10 mcg total) vaginally 2 (two) times a week. At bedtime, Disp: 8 tablet, Rfl: 11   fenofibrate 160 MG tablet, Take 160 mg by mouth daily., Disp: , Rfl:    FLUoxetine (PROZAC) 40 MG capsule, Take 40 mg by mouth daily., Disp: , Rfl:    Insulin Glargine (LANTUS SOLOSTAR) 100 UNIT/ML Solostar Pen, Inject 60 Units into the skin daily. , Disp: , Rfl:    insulin glargine (LANTUS) 100 UNIT/ML injection, Inject 67 Units into the skin daily., Disp: , Rfl:    meclizine (ANTIVERT) 12.5 MG tablet, Take 12.5 mg by mouth 3 (three) times daily as needed for dizziness., Disp: , Rfl:    methimazole (TAPAZOLE) 5 MG tablet, Take 5 mg by mouth daily., Disp: , Rfl:    metoprolol succinate (TOPROL-XL) 100 MG 24 hr tablet, Take 100 mg by mouth daily. Take with or immediately following a meal., Disp: , Rfl:    mirabegron  ER (MYRBETRIQ ) 50 MG TB24 tablet, Take 1 tablet (50 mg total) by mouth daily. At bedtime, Disp: 30 tablet, Rfl: 11   MULTIPLE VITAMINS ESSENTIAL PO, Take by  mouth., Disp: , Rfl:    naphazoline-pheniramine (ALLERGY EYE) 0.025-0.3 % ophthalmic solution, Place 1 drop into both eyes 4 (four) times daily as needed for eye irritation. , Disp: , Rfl:    omeprazole (PRILOSEC) 20 MG capsule, Take 20 mg by mouth 2 (two) times daily as needed. , Disp: , Rfl:    OZEMPIC, 2 MG/DOSE, 8 MG/3ML SOPN, INJECT SUBCUTANEOUSLY 2 MG EVERY WEEK, Disp: , Rfl:    simvastatin (ZOCOR) 40 MG tablet, Take 40 mg by mouth daily., Disp: , Rfl:    solifenacin  (VESICARE ) 10 MG tablet, Take 1 tablet (10 mg total) by mouth daily., Disp: 30 tablet, Rfl: 11   aspirin EC 81 MG tablet, Take 81 mg by mouth daily. (Patient not taking: Reported on 06/01/2023), Disp: , Rfl:    Fluticasone-Salmeterol (ADVAIR DISKUS) 100-50 MCG/DOSE AEPB, Inhale 1 puff into the lungs daily as needed.  (Patient not taking: Reported on 06/01/2023), Disp: , Rfl:    glimepiride (AMARYL) 4 MG tablet, Take 4 mg by mouth daily. (Patient not taking: Reported on 06/01/2023), Disp: , Rfl:    HYDROcodone -acetaminophen  (NORCO/VICODIN) 5-325 MG tablet, Take 1 tablet by mouth every 4 (four) hours as needed. (Patient not taking: Reported on 06/01/2023), Disp: 10 tablet, Rfl: 0   levothyroxine (SYNTHROID, LEVOTHROID) 100 MCG tablet, Take 1 tablet by mouth daily. (Patient not taking: Reported on 06/01/2023), Disp: , Rfl:    lisinopril-hydrochlorothiazide (PRINZIDE,ZESTORETIC) 20-25 MG tablet, Take 1 tablet by mouth daily. (Patient not taking: Reported on 06/01/2023), Disp: , Rfl:    metFORMIN (GLUCOPHAGE-XR) 500 MG 24 hr tablet, Take 1 tablet by mouth 2 (two) times daily. (Patient not taking: Reported on 06/01/2023),  Disp: , Rfl: 11   VICTOZA 18 MG/3ML SOPN, Inject 18 mg into the skin daily.  (Patient not taking: Reported on 06/01/2023), Disp: , Rfl: 3  Objective Blood pressure 133/68, height 5\' 4"  (1.626 m), weight 156 lb (70.8 kg).  General WDWN female NAD Severe atrophic changes Vulva:  normal appearing vulva with no masses,  tenderness or lesions Vagina:  normal mucosa, no discharge Cervix:  Normal no lesions Uterus:  normal size, contour, position, consistency, mobility, non-tender Adnexa: ovaries:present,  normal adnexa in size, nontender and no masses   Pertinent ROS No burning with urination, frequency or urgency No nausea, vomiting or diarrhea Nor fever chills or other constitutional symptoms   Labs or studies     Impression + Management Plan: Diagnoses this Encounter::   ICD-10-CM   1. 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    Dispense:  30 tablet    Refill:  11   Estradiol  (YUVAFEM ) 10 MCG TABS vaginal tablet    Sig: Place 1 tablet (10 mcg total) vaginally at bedtime. For 14 days.    Dispense:  14 tablet    Refill:  0   Estradiol  (YUVAFEM ) 10 MCG TABS vaginal tablet    Sig: Place 1 tablet (10 mcg total) vaginally 2 (two) times a week. At bedtime    Dispense:  8 tablet    Refill:  11   mirabegron  ER (MYRBETRIQ ) 50 MG TB24 tablet    Sig: Take 1 tablet (50 mg total) by mouth daily. At bedtime    Dispense:  30 tablet    Refill:  11    Labs or Scans Ordered during this encounter: No orders of the defined types were placed in this encounter.     Follow up Return in about 6 weeks (around 07/13/2023) for Follow up, with Dr  Randolm Butte.

## 2023-07-13 ENCOUNTER — Encounter: Payer: Self-pay | Admitting: Obstetrics & Gynecology

## 2023-07-13 ENCOUNTER — Ambulatory Visit: Admitting: Obstetrics & Gynecology

## 2023-07-13 VITALS — BP 132/70 | Ht 64.0 in | Wt 158.0 lb

## 2023-07-13 DIAGNOSIS — N952 Postmenopausal atrophic vaginitis: Secondary | ICD-10-CM

## 2023-07-13 DIAGNOSIS — N3941 Urge incontinence: Secondary | ICD-10-CM

## 2023-07-13 DIAGNOSIS — N3281 Overactive bladder: Secondary | ICD-10-CM

## 2023-07-13 DIAGNOSIS — N763 Subacute and chronic vulvitis: Secondary | ICD-10-CM

## 2023-07-13 NOTE — Progress Notes (Signed)
 Follow up appointment for results: OAB  Chief Complaint  Patient presents with   Follow-up    Blood pressure 132/70, height 5' 4 (1.626 m), weight 158 lb (71.7 kg).  Overall patient states she is much improved Much drier and her bottom also feels better, less irritated Pleasantly surprised with her results Recommend continuing her intravesical botox in future with Dr Marykay   MEDS ordered this encounter: No orders of the defined types were placed in this encounter.   Orders for this encounter: No orders of the defined types were placed in this encounter.   Impression + Management Plan   ICD-10-CM   1. 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     All questions were answered.  Past Medical History:  Diagnosis Date   Ankle fracture, right 2014   Closed left arm fracture    Diabetes mellitus without complication (HCC)    Hypertension    Thyroid  disease    Urinary incontinence     Past Surgical History:  Procedure Laterality Date   ABDOMINAL HYSTERECTOMY  1977   menorrhagia   CYST EXCISION  1962   Spine   THYROID  SURGERY  1986   Thyroid  Nodule removal     OB History     Gravida  0   Para  0   Term  0   Preterm  0   AB  0   Living  0      SAB  0   IAB  0   Ectopic  0   Multiple  0   Live Births  0           Allergies  Allergen Reactions   Doxycycline Nausea Only    Chest Tightness    Codeine Nausea And Vomiting   Sulfa Antibiotics Nausea And Vomiting    Social History   Socioeconomic History   Marital status: Widowed    Spouse name: Not on file   Number of children: Not  on file   Years of education: Not on file   Highest education level: Not on file  Occupational History   Not on file  Tobacco Use   Smoking status: Former    Current packs/day: 0.00    Types: Cigarettes    Quit date: 01/04/1988    Years since quitting: 35.5   Smokeless tobacco: Never  Vaping Use   Vaping status: Never Used  Substance and Sexual Activity   Alcohol use: Not Currently   Drug use: Never   Sexual activity: Not Currently    Birth control/protection: Surgical  Other Topics Concern   Not on file  Social History Narrative   Not on file   Social Drivers of Health   Financial Resource Strain: Low Risk  (01/27/2023)   Received from [facility]   Overall Financial Resource Strain (CARDIA)    Difficulty of Paying Living Expenses: Not hard at all  Food Insecurity: No Food Insecurity (01/27/2023)   Received [facility]   Hunger Vital Sign    Within  the past 12 months, you worried that your food would run out before you got the money to buy more.: Never true    Within the past 12 months, the food you bought just didn't last and you didn't have money to get more.: Never true  Transportation Needs: No Transportation Needs (01/27/2023)   Received from [facility]   [facility] - Transportation    Lack of Transportation (Medical): No    Lack of Transportation (Non-Medical): No  Physical Activity: Unknown (07/27/2022)   [facility]   Exercise Vital Sign    On average, how many days per week do you engage in moderate to strenuous exercise (like a brisk walk)?: 0 days    Minutes of Exercise per Session: Not on file  Stress: No Stress Concern Present (07/22/2021)   Received from [facility] - Occupational Stress Questionnaire    Feeling of Stress : Not at all  Social Connections: Socially Integrated (07/22/2021)   Received from [facility]   Social Network    How would you rate your social network (family, work,  friends)?: Good participation with social networks    Family History  Problem Relation Age of Onset   Breast cancer Mother 81   Cancer Other    Hypertension Other    Stroke Other    Heart disease Other    Diabetes Other

## 2023-10-21 ENCOUNTER — Emergency Department (HOSPITAL_BASED_OUTPATIENT_CLINIC_OR_DEPARTMENT_OTHER)
Admission: EM | Admit: 2023-10-21 | Discharge: 2023-10-22 | Disposition: A | Discharge status: Home / Self Care | Attending: Emergency Medicine | Admitting: Emergency Medicine

## 2023-10-21 ENCOUNTER — Other Ambulatory Visit: Payer: Self-pay

## 2023-10-21 ENCOUNTER — Emergency Department (HOSPITAL_BASED_OUTPATIENT_CLINIC_OR_DEPARTMENT_OTHER)

## 2023-10-21 ENCOUNTER — Encounter (HOSPITAL_BASED_OUTPATIENT_CLINIC_OR_DEPARTMENT_OTHER): Payer: Self-pay

## 2023-10-21 DIAGNOSIS — S0100XA Unspecified open wound of scalp, initial encounter: Secondary | ICD-10-CM

## 2023-10-21 DIAGNOSIS — Z7984 Long term (current) use of oral hypoglycemic drugs: Secondary | ICD-10-CM | POA: Diagnosis not present

## 2023-10-21 DIAGNOSIS — Z794 Long term (current) use of insulin: Secondary | ICD-10-CM | POA: Diagnosis not present

## 2023-10-21 DIAGNOSIS — Z79899 Other long term (current) drug therapy: Secondary | ICD-10-CM | POA: Diagnosis not present

## 2023-10-21 DIAGNOSIS — R519 Headache, unspecified: Secondary | ICD-10-CM | POA: Diagnosis present

## 2023-10-21 LAB — CBC WITH DIFFERENTIAL/PLATELET
Abs Immature Granulocytes: 0.04 K/uL (ref 0.00–0.07)
Basophils Absolute: 0.1 K/uL (ref 0.0–0.1)
Basophils Relative: 1 %
Eosinophils Absolute: 0.4 K/uL (ref 0.0–0.5)
Eosinophils Relative: 4 %
HCT: 40.9 % (ref 36.0–46.0)
Hemoglobin: 13.3 g/dL (ref 12.0–15.0)
Immature Granulocytes: 0 %
Lymphocytes Relative: 30 %
Lymphs Abs: 3 K/uL (ref 0.7–4.0)
MCH: 28 pg (ref 26.0–34.0)
MCHC: 32.5 g/dL (ref 30.0–36.0)
MCV: 86.1 fL (ref 80.0–100.0)
Monocytes Absolute: 0.9 K/uL (ref 0.1–1.0)
Monocytes Relative: 9 %
Neutro Abs: 5.5 K/uL (ref 1.7–7.7)
Neutrophils Relative %: 56 %
Platelets: 325 K/uL (ref 150–400)
RBC: 4.75 MIL/uL (ref 3.87–5.11)
RDW: 13 % (ref 11.5–15.5)
WBC: 9.9 K/uL (ref 4.0–10.5)
nRBC: 0 % (ref 0.0–0.2)

## 2023-10-21 MED ORDER — KETOROLAC TROMETHAMINE 30 MG/ML IJ SOLN
30.0000 mg | Freq: Once | INTRAMUSCULAR | Status: AC
  Administered 2023-10-21: 30 mg via INTRAVENOUS
  Filled 2023-10-21: qty 1

## 2023-10-21 MED ORDER — SODIUM CHLORIDE 0.9 % IV BOLUS
500.0000 mL | Freq: Once | INTRAVENOUS | Status: AC
  Administered 2023-10-21: 500 mL via INTRAVENOUS

## 2023-10-21 NOTE — ED Provider Notes (Signed)
 [facility] EMERGENCY DEPARTMENT AT [facility] Provider Note   CSN: [id number] Arrival date & time: 10/21/23  1715     Patient presents with: Head Pain and scab   Helen [name] is a 78 y.o. female.   HPI   78 year old female presents emergency department with wound on top of her head, and now experiencing shocklike pain on the left side of the head.  She was given antibiotics to start from urgent care for the wound but states that her symptoms have not improved.  She is now complaining of a generalized headache that is sharp shooting on the left side.  It does not go in to the face or jaw.  There is no facial droop or associated neurodeficit.  She denies any fever.  No temporal pain or eye symptoms.  Prior to Admission medications   Medication Sig Start Date End Date Taking? Authorizing Provider  albuterol (PROAIR HFA) 108 (90 Base) MCG/ACT inhaler Inhale 2 puffs into the lungs every 6 (six) hours as needed. 12/31/14   [provider]  amLODipine (NORVASC) 10 MG tablet Take 10 mg by mouth daily.    [provider]  aspirin EC 81 MG tablet Take 81 mg by mouth daily. Patient not taking: Reported on 07/13/2023    [provider]  Cholecalciferol (VITAMIN D3) 1000 units CAPS Take 1 capsule by mouth daily.    [provider]  empagliflozin (JARDIANCE) 25 MG TABS tablet Take 25 mg by mouth daily.    [provider]  Estradiol  (YUVAFEM ) 10 MCG TABS vaginal tablet Place 1 tablet (10 mcg total) vaginally at bedtime. For 14 days. 06/01/23   Jayne Vonn DEL, MD  Estradiol  (YUVAFEM ) 10 MCG TABS vaginal tablet Place 1 tablet (10 mcg total) vaginally 2 (two) times a week. At bedtime 06/01/23   Jayne Vonn DEL, MD  fenofibrate 160 MG tablet Take 160 mg by mouth daily.    [provider]  FLUoxetine (PROZAC) 40 MG capsule Take 40 mg by mouth daily.    [provider]  Fluticasone-Salmeterol (ADVAIR DISKUS) 100-50 MCG/DOSE AEPB Inhale  1 puff into the lungs daily as needed.  04/27/12   [provider]  glimepiride (AMARYL) 4 MG tablet Take 4 mg by mouth daily. Patient not taking: Reported on 07/13/2023    [provider]  HYDROcodone -acetaminophen  (NORCO/VICODIN) 5-325 MG tablet Take 1 tablet by mouth every 4 (four) hours as needed. Patient not taking: Reported on 07/13/2023 01/25/20   Bero, Michael M, MD  Insulin Glargine (LANTUS SOLOSTAR) 100 UNIT/ML Solostar Pen Inject 60 Units into the skin daily.  06/22/16   [provider]  insulin glargine (LANTUS) 100 UNIT/ML injection Inject 67 Units into the skin daily.    [provider]  levothyroxine (SYNTHROID, LEVOTHROID) 100 MCG tablet Take 1 tablet by mouth daily. Patient not taking: Reported on 07/13/2023 05/24/17   [provider]  lisinopril-hydrochlorothiazide (PRINZIDE,ZESTORETIC) 20-25 MG tablet Take 1 tablet by mouth daily.    [provider]  meclizine (ANTIVERT) 12.5 MG tablet Take 12.5 mg by mouth 3 (three) times daily as needed for dizziness.    [provider]  metFORMIN (GLUCOPHAGE-XR) 500 MG 24 hr tablet Take 1 tablet by mouth 2 (two) times daily. Patient not taking: Reported on 07/13/2023 05/24/17   [provider]  methimazole (TAPAZOLE) 5 MG tablet Take 5 mg by mouth daily.    [provider]  metoprolol succinate (TOPROL-XL) 100 MG 24 hr tablet Take  100 mg by mouth daily. Take with or immediately following a meal.    [provider]  mirabegron  ER (MYRBETRIQ ) 50 MG TB24 tablet Take 1 tablet (50 mg total) by mouth daily. At bedtime 06/01/23   Jayne Vonn DEL, MD  MULTIPLE VITAMINS ESSENTIAL PO Take by mouth.    [provider]  naphazoline-pheniramine (ALLERGY EYE) 0.025-0.3 % ophthalmic solution Place 1 drop into both eyes 4 (four) times daily as needed for eye irritation.     [provider]  omeprazole (PRILOSEC) 20 MG capsule Take 20 mg by mouth 2 (two) times  daily as needed.     [provider]  OZEMPIC, 2 MG/DOSE, 8 MG/3ML SOPN INJECT SUBCUTANEOUSLY 2 MG EVERY WEEK 12/08/20   [provider]  simvastatin (ZOCOR) 40 MG tablet Take 40 mg by mouth daily.    [provider]  solifenacin  (VESICARE ) 10 MG tablet Take 1 tablet (10 mg total) by mouth daily. 06/01/23   Jayne Vonn DEL, MD  VICTOZA 18 MG/3ML SOPN Inject 18 mg into the skin daily.  Patient not taking: Reported on 07/13/2023 05/21/17   [provider]    Allergies: Doxycycline, Codeine, and Sulfa antibiotics    Review of Systems  Constitutional:  Negative for fever.  Respiratory:  Negative for shortness of breath.   Cardiovascular:  Negative for chest pain.  Gastrointestinal:  Negative for abdominal pain, diarrhea and vomiting.  Skin:  Negative for rash.       Wound and scalp  Neurological:  Positive for headaches. Negative for facial asymmetry, speech difficulty and weakness.    Updated Vital Signs BP (!) 155/72   Pulse 64   Temp 98.5 F (36.9 C)   Resp 18   SpO2 94%   Physical Exam Vitals and nursing note reviewed.  Constitutional:      General: She is not in acute distress.    Appearance: Normal appearance.  HENT:     Head: Normocephalic.     Comments: Area at the apex of the scalp that looks concerning for basal cell carcinoma, no other rash or findings of shingles in the scalp.  No temporal or TMJ tenderness    Right Ear: Tympanic membrane normal.     Left Ear: Tympanic membrane normal.     Mouth/Throat:     Mouth: Mucous membranes are moist.  Eyes:     Extraocular Movements: Extraocular movements intact.     Pupils: Pupils are equal, round, and reactive to light.  Cardiovascular:     Rate and Rhythm: Normal rate.  Pulmonary:     Effort: Pulmonary effort is normal. No respiratory distress.  Abdominal:     Palpations: Abdomen is soft.     Tenderness: There is no abdominal tenderness.  Skin:    General: Skin is warm.   Neurological:     Mental Status: She is alert and oriented to person, place, and time. Mental status is at baseline.  Psychiatric:        Mood and Affect: Mood normal.     (all labs ordered are listed, but only abnormal results are displayed) Labs Reviewed  CBC WITH DIFFERENTIAL/PLATELET  BASIC METABOLIC PANEL WITH GFR    EKG: None  Radiology: No results found.   Procedures   Medications Ordered in the ED  sodium chloride 0.9 % bolus 500 mL (has no administration in time range)  ketorolac (TORADOL) 30 MG/ML injection 30 mg (has no administration in time range)  Medical Decision Making Amount and/or Complexity of Data Reviewed Labs: ordered. Radiology: ordered.  Risk Prescription drug management.   78 year old female presents emergency department with scalp wound and left-sided headache.  She was seen by urgent care and given antibiotics however the wound on the scalp looks concerning for basal cell carcinoma.  She does have follow-up with dermatology in 2 months but I have encouraged her to be seen earlier for biopsy, she understands.  In regards to left-sided headache.  There is no shingles or rash.  This is not going to the temple area of the face.  Does not seem like TMJ, temporal arteritis or trigeminal neuralgia.  However while talking to her exam she is frequently grimacing and grabbing the left side of her head.  No history of headaches or migraines.  Will plan for blood work, IV therapy and CT scan for further evaluation.  Patient signed out pending results and reevaluation.     Final diagnoses:  None    ED Discharge Orders     None          Bari Roxie HERO, DO 10/21/23 2318

## 2023-10-21 NOTE — ED Triage Notes (Addendum)
 Patient reports pain starting in her left ear and radiating up her head. She has an open sore ontop her her head where the pain ends. She says she went to an urgent care and they told her just to wash it with baby shampoo. She says when the pain hits it feels like a bolt of lightning.   Ibuprofen and tylenol  taken with minimal relief.   Reports urgent care gave her antibiotics 4x/day for the wound on her scalp and a cream to apply.

## 2023-10-22 LAB — BASIC METABOLIC PANEL WITH GFR
BUN: 22 mg/dL (ref 8–23)
CO2: 25 mmol/L (ref 22–32)
Calcium: 10.1 mg/dL (ref 8.9–10.3)
Chloride: 102 mmol/L (ref 98–111)
Creatinine, Ser: 0.54 mg/dL (ref 0.44–1.00)
GFR, Estimated: 7 mL/min — ABNORMAL LOW (ref >60)
Glucose, Bld: 119 mg/dL — ABNORMAL HIGH (ref 70–99)
Potassium: 3.7 mmol/L (ref 3.5–5.1)
Sodium: 139 mmol/L (ref 135–145)

## 2023-10-22 MED ORDER — NAPROXEN 375 MG PO TABS: 375.0000 mg | ORAL_TABLET | Freq: Two times a day (BID) | ORAL | 0 refills | Status: AC

## 2023-10-22 NOTE — Discharge Instructions (Addendum)
 You have been seen and discharged from the emergency department.  Your blood work and head CT were normal.  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  Take anti-inflammatory pain medicine as prescribed.  Rest and stay hydrated.  Follow-up with your primary provider for further evaluation and further care. Take home medications as prescribed. If you have any worsening symptoms, vision changes, facial pain or further concerns for your health please return to an emergency department for further evaluation.

## 2023-11-16 ENCOUNTER — Ambulatory Visit: Attending: Nurse Practitioner | Admitting: Physical Therapy

## 2023-11-16 DIAGNOSIS — M6281 Muscle weakness (generalized): Secondary | ICD-10-CM | POA: Diagnosis present

## 2023-11-16 DIAGNOSIS — R2689 Other abnormalities of gait and mobility: Secondary | ICD-10-CM | POA: Diagnosis present

## 2023-11-16 DIAGNOSIS — R2681 Unsteadiness on feet: Secondary | ICD-10-CM | POA: Diagnosis present

## 2023-11-16 NOTE — Therapy (Signed)
 OUTPATIENT PHYSICAL THERAPY LOWER EXTREMITY EVALUATION   Patient Name: Helen [name] MRN: [id number] DOB:[birth year], 78 y.o., female Today's Date: 11/16/2023  END OF SESSION:  PT End of Session - 11/16/23 1355     Visit Number 1    Number of Visits 16    Date for Recertification  01/11/24    Authorization Type [employer] Medicare    PT Start Time 1350    PT Stop Time 1430    PT Time Calculation (min) 40 min    Activity Tolerance Patient tolerated treatment well          Past Medical History:  Diagnosis Date   Ankle fracture, right 2014   Closed left arm fracture    Diabetes mellitus without complication (HCC)    Hypertension    Thyroid  disease    Urinary incontinence    Past Surgical History:  Procedure Laterality Date   ABDOMINAL HYSTERECTOMY  1977   menorrhagia   CYST EXCISION  1962   Spine   THYROID  SURGERY  1986   Thyroid  Nodule removal    Patient Active Problem List   Diagnosis Date Noted   Urge incontinence 06/01/2023   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   Chronic vulvitis 06/01/2023   Vitamin D deficiency 06/12/2017   Type II diabetes mellitus, well controlled (HCC) 09/14/2016   Long-term insulin use (HCC) 06/26/2013   Hypothyroidism 04/15/2010   Hyperlipidemia LDL goal <100 04/15/2010   Essential hypertension 04/15/2010   Osteoarthrosis involving upper arm 07/20/2007    PCP: Lenon [name], FNP  REFERRING PROVIDER: Arby Lyle LABOR, NP  REFERRING DIAG: Z91.81 (ICD-10-CM) - History of falling  THERAPY DIAG:  Muscle weakness (generalized)  Unsteadiness on feet  Other abnormalities of gait and mobility  Rationale for Evaluation and Treatment: Rehabilitation  ONSET DATE: last few months   SUBJECTIVE:   SUBJECTIVE STATEMENT: Pt states she's been falling recently. States her balance goes all of a sudden. Pt reports she does have cane but forgets to use it.   PERTINENT HISTORY: Skin cancer, low back pain  PAIN:  Are you  having pain? No Back pain with over exertion, diminishes with rest  PRECAUTIONS: Fall  RED FLAGS: None   WEIGHT BEARING RESTRICTIONS: No  FALLS:  Has patient fallen in last 6 months? Yes. Number of falls 5-6 times; last fall last week (fell 2 times in one day). Has fallen inside and outside the house  LIVING ENVIRONMENT: Lives with: lives alone Lives in: House/apartment Stairs: 5 steps in the back with a rail Has following equipment at home: Single point cane, Shower bench, and Grab bars  OCCUPATION: Likes to take her dog for a walk  PLOF: Independent  PATIENT GOALS: Decrease pain, sleep longer, improve strength, stand longer, less difficulty with home activities  NEXT MD VISIT: PRN  OBJECTIVE:  Note: Objective measures were completed at Evaluation unless otherwise noted.  DIAGNOSTIC FINDINGS: none  PATIENT SURVEYS:  PSFS: THE PATIENT SPECIFIC FUNCTIONAL SCALE  Place score of 0-10 (0 = unable to perform activity and 10 = able to perform activity at the same level as before injury or problem)  Activity Date: 11/13    Sitting to standing 4    2.  Turning 3    3.  Steps 5         Average Total Score 4      Total Score = Sum of activity scores/number of activities  Minimally Detectable Change: 3 points (for single activity); 2 points (for average  score)  [facility] (nd). The Patient Specific Functional Scale . Retrieved from [email or web address]   COGNITION: Overall cognitive status: Within functional limits for tasks assessed     SENSATION: WFL  POSTURE: No Significant postural limitations   LOWER EXTREMITY ROM: WNL  LOWER EXTREMITY MMT:  MMT Right eval Left eval  Hip flexion 4 4  Hip extension    Hip abduction    Hip adduction    Hip internal rotation    Hip external rotation    Knee flexion 4 4  Knee extension 4 4  Ankle dorsiflexion    Ankle plantarflexion    Ankle inversion     Ankle eversion     (Blank rows = not tested)  LOWER EXTREMITY SPECIAL TESTS:    FUNCTIONAL TESTS:  5 times sit to stand: 25.09 sec with UE assist Berg Balance Test: 45/56 DGI: 15/24  [facility] PT Assessment - 11/16/23 0001       Standardized Balance Assessment   Standardized Balance Assessment Berg Balance Test;Dynamic Gait Index      Berg Balance Test   Sit to Stand Able to stand without using hands and stabilize independently    Standing Unsupported Able to stand safely 2 minutes    Sitting with Back Unsupported but Feet Supported on Floor or Stool Able to sit safely and securely 2 minutes    Stand to Sit Controls descent by using hands    Transfers Able to transfer safely, minor use of hands    Standing Unsupported with Eyes Closed Able to stand 10 seconds safely    Standing Unsupported with Feet Together Able to place feet together independently and stand 1 minute safely    From Standing, Reach Forward with Outstretched Arm Can reach confidently >25 cm (10)    From Standing Position, Pick up Object from Floor Able to pick up shoe safely and easily    From Standing Position, Turn to Look Behind Over each Shoulder Turn sideways only but maintains balance    Turn 360 Degrees Able to turn 360 degrees safely but slowly   10 sec   Standing Unsupported, Alternately Place Feet on Step/Stool Able to complete 4 steps without aid or supervision    Standing Unsupported, One Foot in Front Able to take small step independently and hold 30 seconds    Standing on One Leg Able to lift leg independently and hold equal to or more than 3 seconds   3.5 sec on L, 2.65 sec on R   Total Score 45      Dynamic Gait Index   Level Surface Moderate Impairment    Change in Gait Speed Moderate Impairment    Gait with Horizontal Head Turns Mild Impairment    Gait with Vertical Head Turns Mild Impairment    Gait and Pivot Turn Mild Impairment    Step Over Obstacle Mild Impairment    Step Around Obstacles  Normal    Steps Mild Impairment    Total Score 15           GAIT: Distance walked: Into clinic Assistive device utilized: None Level of assistance: Complete Independence Comments: Slow and guarded, diminished step length bilat, diminished arm swing bilat  TREATMENT DATE:      PATIENT EDUCATION:  Education details: Exam findings, POC Person educated: Patient Education method: Explanation, Demonstration, and Handouts Education comprehension: verbalized understanding, returned demonstration, and needs further education  HOME EXERCISE PROGRAM: To be initiated  ASSESSMENT:  CLINICAL IMPRESSION: Patient is a 78 y.o. F who was seen today for physical therapy evaluation and treatment for falls. Assessment indicates high fall risk based on her Berg Balance Test, 5x STS and DGI. Pt demos general weakness with greatest difficulty performing adequate weight shifts to advance LEs without occasionally catching the front of her toes. Pt will benefit from PT to address these deficits to improve home and community safety.   OBJECTIVE IMPAIRMENTS: Abnormal gait, decreased balance, decreased endurance, decreased mobility, difficulty walking, decreased strength, and postural dysfunction.   ACTIVITY LIMITATIONS: standing, stairs, transfers, and locomotion level  PARTICIPATION LIMITATIONS: meal prep, cleaning, shopping, community activity, and yard work  PERSONAL FACTORS: Age, Fitness, Past/current experiences, and Time since onset of injury/illness/exacerbation are also affecting patient's functional outcome.   REHAB POTENTIAL: Good  CLINICAL DECISION MAKING: Evolving/moderate complexity  EVALUATION COMPLEXITY: Moderate   GOALS: Goals reviewed with patient? Yes  SHORT TERM GOALS: Target date: 12/14/2023  Pt will be ind with initial HEP Baseline: Goal  status: INITIAL  2.  Pt will have improved 5x STS to </=20 sec to demo increased functional LE strength Baseline:  Goal status: INITIAL   LONG TERM GOALS: Target date: 01/11/2024   Pt will be ind with management and progression of HEP Baseline:  Goal status: INITIAL  2.  Pt will have improved PSFS score to >/=6 to demo MCID Baseline:  Goal status: INITIAL  3.  Pt will have improved Berg Balance Score to >/=52/56 to demo low fall risk Baseline:  Goal status: INITIAL  4.  Pt will have improved DGI to >/=20/24 to demo low fall risk Baseline:  Goal status: INITIAL     PLAN:  PT FREQUENCY: 2x/week  PT DURATION: 8 weeks  PLANNED INTERVENTIONS: 97164- PT Re-evaluation, 97110-Therapeutic exercises, 97530- Therapeutic activity, [id number]- Neuromuscular re-education, 97535- Self Care, 02859- Manual therapy, [id number]- Gait training, [phone]- Electrical stimulation (unattended), Patient/Family education, Balance training, Stair training, Taping, Cryotherapy, and Moist heat  PLAN FOR NEXT SESSION: Initiate balance and strength HEP.    Cai Anfinson April Ma L Chondra Boyde, PT 11/16/2023, 3:50 PM

## 2023-11-21 ENCOUNTER — Ambulatory Visit: Admitting: *Deleted

## 2023-11-23 ENCOUNTER — Encounter: Payer: Self-pay | Admitting: *Deleted

## 2023-11-23 ENCOUNTER — Ambulatory Visit: Admitting: *Deleted

## 2023-11-23 DIAGNOSIS — R2681 Unsteadiness on feet: Secondary | ICD-10-CM

## 2023-11-23 DIAGNOSIS — M6281 Muscle weakness (generalized): Secondary | ICD-10-CM

## 2023-11-23 DIAGNOSIS — R2689 Other abnormalities of gait and mobility: Secondary | ICD-10-CM

## 2023-11-23 NOTE — Therapy (Signed)
 OUTPATIENT PHYSICAL THERAPY LOWER EXTREMITY TREATMENT   Patient Name: Helen [name] MRN: [id number] DOB:04-30-1945, 78 y.o., female Today's Date: 11/23/2023  END OF SESSION:  PT End of Session - 11/23/23 1404     Visit Number 2    Number of Visits 16    Date for Recertification  01/11/24    Authorization Type [employer] Medicare    PT Start Time 1357   Pt 13 mins late   PT Stop Time 1444    PT Time Calculation (min) 47 min           Past Medical History:  Diagnosis Date   Ankle fracture, right 2014   Closed left arm fracture    Diabetes mellitus without complication (HCC)    Hypertension    Thyroid  disease    Urinary incontinence    Past Surgical History:  Procedure Laterality Date   ABDOMINAL HYSTERECTOMY  1977   menorrhagia   CYST EXCISION  1962   Spine   THYROID  SURGERY  1986   Thyroid  Nodule removal    Patient Active Problem List   Diagnosis Date Noted   Urge incontinence 06/01/2023   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   Chronic vulvitis 06/01/2023   Vitamin D deficiency 06/12/2017   Type II diabetes mellitus, well controlled (HCC) 09/14/2016   Long-term insulin use (HCC) 06/26/2013   Hypothyroidism 04/15/2010   Hyperlipidemia LDL goal <100 04/15/2010   Essential hypertension 04/15/2010   Osteoarthrosis involving upper arm 07/20/2007    PCP: Lenon [name], FNP  REFERRING PROVIDER: Arby Lyle LABOR, NP  REFERRING DIAG: Z91.81 (ICD-10-CM) - History of falling  THERAPY DIAG:  Muscle weakness (generalized)  Unsteadiness on feet  Other abnormalities of gait and mobility  Rationale for Evaluation and Treatment: Rehabilitation  ONSET DATE: last few months   SUBJECTIVE:   SUBJECTIVE STATEMENT: Pt states she's been falling recently. Pt reports she does have cane but forgets to use it. No cane today  PERTINENT HISTORY: Skin cancer, low back pain  PAIN:  Are you having pain? No Back pain with over exertion, diminishes with  rest  PRECAUTIONS: Fall  RED FLAGS: None   WEIGHT BEARING RESTRICTIONS: No  FALLS:  Has patient fallen in last 6 months? Yes. Number of falls 5-6 times; last fall last week (fell 2 times in one day). Has fallen inside and outside the house  LIVING ENVIRONMENT: Lives with: lives alone Lives in: House/apartment Stairs: 5 steps in the back with a rail Has following equipment at home: Single point cane, Shower bench, and Grab bars  OCCUPATION: Likes to take her dog for a walk  PLOF: Independent  PATIENT GOALS: Decrease pain, sleep longer, improve strength, stand longer, less difficulty with home activities  NEXT MD VISIT: PRN  OBJECTIVE:  Note: Objective measures were completed at Evaluation unless otherwise noted.  DIAGNOSTIC FINDINGS: none  PATIENT SURVEYS:  PSFS: THE PATIENT SPECIFIC FUNCTIONAL SCALE  Place score of 0-10 (0 = unable to perform activity and 10 = able to perform activity at the same level as before injury or problem)  Activity Date: 11/13    Sitting to standing 4    2.  Turning 3    3.  Steps 5         Average Total Score 4      Total Score = Sum of activity scores/number of activities  Minimally Detectable Change: 3 points (for single activity); 2 points (for average score)  [facility] (nd). The Patient  Specific Functional Scale . Retrieved from [email or web address]   COGNITION: Overall cognitive status: Within functional limits for tasks assessed     SENSATION: WFL  POSTURE: No Significant postural limitations   LOWER EXTREMITY ROM: WNL  LOWER EXTREMITY MMT:  MMT Right eval Left eval  Hip flexion 4 4  Hip extension    Hip abduction    Hip adduction    Hip internal rotation    Hip external rotation    Knee flexion 4 4  Knee extension 4 4  Ankle dorsiflexion    Ankle plantarflexion    Ankle inversion    Ankle eversion     (Blank rows = not tested)  LOWER  EXTREMITY SPECIAL TESTS:    FUNCTIONAL TESTS:  5 times sit to stand: 25.09 sec with UE assist Berg Balance Test: 45/56 DGI: 15/24     GAIT: Distance walked: Into clinic Assistive device utilized: None Level of assistance: Complete Independence Comments: Slow and guarded, diminished step length bilat, diminished arm swing bilat                                                                                                                                TREATMENT DATE: 11/23/23                                     EXERCISE LOG   Balance  Exercise Repetitions and Resistance Comments  Nustep L3 x 12 mins    Rocker board  X 6 mins   Tandem stance  2x holds   8in box  Toe taps  2x20   LAQs 2# 2x10 Bil   Seated marching 2 # 2x 10 Bil        Blank cell = exercise not performed today  Discussed HEP  PATIENT EDUCATION:  Education details: Exam findings, POC Person educated: Patient Education method: Explanation, Demonstration, and Handouts Education comprehension: verbalized understanding, returned demonstration, and needs further education  HOME EXERCISE PROGRAM: To be initiated  ASSESSMENT:  CLINICAL IMPRESSION: Patient arrived today doing fairly well. Rx focused on therex for strengthening as well as balance act.'s.Pt needing SBA during balance act's and UE assist PRN.     OBJECTIVE IMPAIRMENTS: Abnormal gait, decreased balance, decreased endurance, decreased mobility, difficulty walking, decreased strength, and postural dysfunction.   ACTIVITY LIMITATIONS: standing, stairs, transfers, and locomotion level  PARTICIPATION LIMITATIONS: meal prep, cleaning, shopping, community activity, and yard work  PERSONAL FACTORS: Age, Fitness, Past/current experiences, and Time since onset of injury/illness/exacerbation are also affecting patient's functional outcome.   REHAB POTENTIAL: Good  CLINICAL DECISION MAKING: Evolving/moderate complexity  EVALUATION COMPLEXITY:  Moderate   GOALS: Goals reviewed with patient? Yes  SHORT TERM GOALS: Target date: 12/14/2023  Pt will be ind with initial HEP Baseline: Goal status: INITIAL  2.  Pt will have improved 5x STS to </=20 sec to demo  increased functional LE strength Baseline:  Goal status: INITIAL   LONG TERM GOALS: Target date: 01/11/2024   Pt will be ind with management and progression of HEP Baseline:  Goal status: INITIAL  2.  Pt will have improved PSFS score to >/=6 to demo MCID Baseline:  Goal status: INITIAL  3.  Pt will have improved Berg Balance Score to >/=52/56 to demo low fall risk Baseline:  Goal status: INITIAL  4.  Pt will have improved DGI to >/=20/24 to demo low fall risk Baseline:  Goal status: INITIAL     PLAN:  PT FREQUENCY: 2x/week  PT DURATION: 8 weeks  PLANNED INTERVENTIONS: 97164- PT Re-evaluation, 97110-Therapeutic exercises, 97530- Therapeutic activity, [id number]- Neuromuscular re-education, 97535- Self Care, 02859- Manual therapy, [id number]- Gait training, [phone]- Electrical stimulation (unattended), Patient/Family education, Balance training, Stair training, Taping, Cryotherapy, and Moist heat  PLAN FOR NEXT SESSION: Initiate balance and strength HEP.    Flor Whitacre,CHRIS, PTA 11/23/2023, 6:09 PM

## 2023-11-28 ENCOUNTER — Ambulatory Visit: Admitting: *Deleted

## 2023-11-28 DIAGNOSIS — M6281 Muscle weakness (generalized): Secondary | ICD-10-CM

## 2023-11-28 DIAGNOSIS — R2681 Unsteadiness on feet: Secondary | ICD-10-CM

## 2023-11-28 DIAGNOSIS — R2689 Other abnormalities of gait and mobility: Secondary | ICD-10-CM

## 2023-11-28 NOTE — Therapy (Signed)
 OUTPATIENT PHYSICAL THERAPY LOWER EXTREMITY TREATMENT   Patient Name: [name] MRN: [id number] DOB:09/05/1945, 78 y.o., female Today's Date: 11/28/2023  END OF SESSION:  PT End of Session - 11/28/23 1428     Visit Number 3    Number of Visits 16    Date for Recertification  01/11/24    Authorization Type [employer] Medicare    PT Start Time 1415    PT Stop Time 1514    PT Time Calculation (min) 59 min           Past Medical History:  Diagnosis Date   Ankle fracture, right 2014   Closed left arm fracture    Diabetes mellitus without complication (HCC)    Hypertension    Thyroid  disease    Urinary incontinence    Past Surgical History:  Procedure Laterality Date   ABDOMINAL HYSTERECTOMY  1977   menorrhagia   CYST EXCISION  1962   Spine   THYROID  SURGERY  1986   Thyroid  Nodule removal    Patient Active Problem List   Diagnosis Date Noted   Urge incontinence 06/01/2023   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   Chronic vulvitis 06/01/2023   Vitamin D deficiency 06/12/2017   Type II diabetes mellitus, well controlled (HCC) 09/14/2016   Long-term insulin use (HCC) 06/26/2013   Hypothyroidism 04/15/2010   Hyperlipidemia LDL goal <100 04/15/2010   Essential hypertension 04/15/2010   Osteoarthrosis involving upper arm 07/20/2007    PCP: Helen [name], FNP  REFERRING PROVIDER: Arby Lyle LABOR, NP  REFERRING DIAG: Z91.81 (ICD-10-CM) - History of falling  THERAPY DIAG:  Muscle weakness (generalized)  Unsteadiness on feet  Other abnormalities of gait and mobility  Rationale for Evaluation and Treatment: Rehabilitation  ONSET DATE: last few months   SUBJECTIVE:   SUBJECTIVE STATEMENT: Did okay after last Rx. Decreased knee pain today.  PERTINENT HISTORY: Skin cancer, low back pain  PAIN:  Are you having pain? No Back pain with over exertion, diminishes with rest  PRECAUTIONS: Fall  RED FLAGS: None   WEIGHT BEARING RESTRICTIONS:  No  FALLS:  Has patient fallen in last 6 months? Yes. Number of falls 5-6 times; last fall last week (fell 2 times in one day). Has fallen inside and outside the house  LIVING ENVIRONMENT: Lives with: lives alone Lives in: House/apartment Stairs: 5 steps in the back with a rail Has following equipment at home: Single point cane, Shower bench, and Grab bars  OCCUPATION: Likes to take her dog for a walk  PLOF: Independent  PATIENT GOALS: Decrease pain, sleep longer, improve strength, stand longer, less difficulty with home activities  NEXT MD VISIT: PRN  OBJECTIVE:  Note: Objective measures were completed at Evaluation unless otherwise noted.  DIAGNOSTIC FINDINGS: none  PATIENT SURVEYS:  PSFS: THE PATIENT SPECIFIC FUNCTIONAL SCALE  Place score of 0-10 (0 = unable to perform activity and 10 = able to perform activity at the same level as before injury or problem)  Activity Date: 11/13    Sitting to standing 4    2.  Turning 3    3.  Steps 5         Average Total Score 4      Total Score = Sum of activity scores/number of activities  Minimally Detectable Change: 3 points (for single activity); 2 points (for average score)  [facility] (nd). The Patient Specific Functional Scale . Retrieved from [email or web address].[email or web address]   COGNITION: Overall cognitive status: Within functional limits  for tasks assessed     SENSATION: WFL  POSTURE: No Significant postural limitations   LOWER EXTREMITY ROM: WNL  LOWER EXTREMITY MMT:  MMT Right eval Left eval  Hip flexion 4 4  Hip extension    Hip abduction    Hip adduction    Hip internal rotation    Hip external rotation    Knee flexion 4 4  Knee extension 4 4  Ankle dorsiflexion    Ankle plantarflexion    Ankle inversion    Ankle eversion     (Blank rows = not tested)  LOWER EXTREMITY SPECIAL TESTS:    FUNCTIONAL TESTS:  5 times sit to stand: 25.09 sec  with UE assist Berg Balance Test: 45/56 DGI: 15/24     GAIT: Distance walked: Into clinic Assistive device utilized: None Level of assistance: Complete Independence Comments: Slow and guarded, diminished step length bilat, diminished arm swing bilat                                                                                                                                TREATMENT DATE: 11/27/23                                     EXERCISE LOG   Balance  Exercise Repetitions and Resistance Comments  Nustep L1 x 14 mins    Rocker board  X 6 mins   Tandem stance  2x holds   8in box  Toe taps  2x20   LAQs 2# 2x10 Bil   Seated marching 2 # 2x 10 Bil   Sit to stand X5 with UE assist    Blank cell = exercise not performed today  Discussed HEP  PATIENT EDUCATION:  Education details: Exam findings, POC Person educated: Patient Education method: Explanation, Demonstration, and Handouts Education comprehension: verbalized understanding, returned demonstration, and needs further education  HOME EXERCISE PROGRAM: To be initiated  ASSESSMENT:  CLINICAL IMPRESSION: Patient arrived today doing fairly well and reports having less pain after last Rx . Rx focused  again on therex for strengthening as well as balance act.'s.Pt needing SBA during balance act's and UE assist PRN.     OBJECTIVE IMPAIRMENTS: Abnormal gait, decreased balance, decreased endurance, decreased mobility, difficulty walking, decreased strength, and postural dysfunction.   ACTIVITY LIMITATIONS: standing, stairs, transfers, and locomotion level  PARTICIPATION LIMITATIONS: meal prep, cleaning, shopping, community activity, and yard work  PERSONAL FACTORS: Age, Fitness, Past/current experiences, and Time since onset of injury/illness/exacerbation are also affecting patient's functional outcome.   REHAB POTENTIAL: Good  CLINICAL DECISION MAKING: Evolving/moderate complexity  EVALUATION COMPLEXITY:  Moderate   GOALS: Goals reviewed with patient? Yes  SHORT TERM GOALS: Target date: 12/14/2023  Pt will be ind with initial HEP Baseline: Goal status: INITIAL  2.  Pt will have improved 5x STS to </=20 sec to demo  increased functional LE strength Baseline:  Goal status: INITIAL   LONG TERM GOALS: Target date: 01/11/2024   Pt will be ind with management and progression of HEP Baseline:  Goal status: INITIAL  2.  Pt will have improved PSFS score to >/=6 to demo MCID Baseline:  Goal status: INITIAL  3.  Pt will have improved Berg Balance Score to >/=52/56 to demo low fall risk Baseline:  Goal status: INITIAL  4.  Pt will have improved DGI to >/=20/24 to demo low fall risk Baseline:  Goal status: INITIAL     PLAN:  PT FREQUENCY: 2x/week  PT DURATION: 8 weeks  PLANNED INTERVENTIONS: 97164- PT Re-evaluation, 97110-Therapeutic exercises, 97530- Therapeutic activity, [id number]- Neuromuscular re-education, 97535- Self Care, 02859- Manual therapy, [id number]- Gait training, [phone]- Electrical stimulation (unattended), Patient/Family education, Balance training, Stair training, Taping, Cryotherapy, and Moist heat  PLAN FOR NEXT SESSION: Initiate balance and strength HEP.    Charlie Char,CHRIS, PTA 11/28/2023, 3:14 PM

## 2023-12-05 ENCOUNTER — Ambulatory Visit: Attending: Nurse Practitioner | Admitting: *Deleted

## 2023-12-05 DIAGNOSIS — M6281 Muscle weakness (generalized): Secondary | ICD-10-CM | POA: Insufficient documentation

## 2023-12-05 DIAGNOSIS — R2681 Unsteadiness on feet: Secondary | ICD-10-CM | POA: Diagnosis present

## 2023-12-05 DIAGNOSIS — R2689 Other abnormalities of gait and mobility: Secondary | ICD-10-CM | POA: Insufficient documentation

## 2023-12-05 NOTE — Therapy (Signed)
 OUTPATIENT PHYSICAL THERAPY LOWER EXTREMITY TREATMENT   Patient Name: Helen [name] MRN: [id number] DOB:[birth year], 78 y.o., female Today's Date: 12/05/2023  END OF SESSION:  PT End of Session - 12/05/23 1535     Visit Number 4    Number of Visits 16    Date for Recertification  01/11/24    Authorization Type [employer] Medicare    PT Start Time 1100    PT Stop Time 1150    PT Time Calculation (min) 50 min            Past Medical History:  Diagnosis Date   Ankle fracture, right 2014   Closed left arm fracture    Diabetes mellitus without complication (HCC)    Hypertension    Thyroid  disease    Urinary incontinence    Past Surgical History:  Procedure Laterality Date   ABDOMINAL HYSTERECTOMY  1977   menorrhagia   CYST EXCISION  1962   Spine   THYROID  SURGERY  1986   Thyroid  Nodule removal    Patient Active Problem List   Diagnosis Date Noted   Urge incontinence 06/01/2023   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   Chronic vulvitis 06/01/2023   Vitamin D deficiency 06/12/2017   Type II diabetes mellitus, well controlled (HCC) 09/14/2016   Long-term insulin use (HCC) 06/26/2013   Hypothyroidism 04/15/2010   Hyperlipidemia LDL goal <100 04/15/2010   Essential hypertension 04/15/2010   Osteoarthrosis involving upper arm 07/20/2007    PCP: Lenon [name], FNP  REFERRING PROVIDER: Arby Lyle LABOR, NP  REFERRING DIAG: Z91.81 (ICD-10-CM) - History of falling  THERAPY DIAG:  Muscle weakness (generalized)  Unsteadiness on feet  Other abnormalities of gait and mobility  Rationale for Evaluation and Treatment: Rehabilitation  ONSET DATE: last few months   SUBJECTIVE:   SUBJECTIVE STATEMENT: Did okay after last Rx. Feeling good today  PERTINENT HISTORY: Skin cancer, low back pain  PAIN:  Are you having pain? No Back pain with over exertion, diminishes with rest  PRECAUTIONS: Fall  RED FLAGS: None   WEIGHT BEARING RESTRICTIONS:  No  FALLS:  Has patient fallen in last 6 months? Yes. Number of falls 5-6 times; last fall last week (fell 2 times in one day). Has fallen inside and outside the house  LIVING ENVIRONMENT: Lives with: lives alone Lives in: House/apartment Stairs: 5 steps in the back with a rail Has following equipment at home: Single point cane, Shower bench, and Grab bars  OCCUPATION: Likes to take her dog for a walk  PLOF: Independent  PATIENT GOALS: Decrease pain, sleep longer, improve strength, stand longer, less difficulty with home activities  NEXT MD VISIT: PRN  OBJECTIVE:  Note: Objective measures were completed at Evaluation unless otherwise noted.  DIAGNOSTIC FINDINGS: none  PATIENT SURVEYS:  PSFS: THE PATIENT SPECIFIC FUNCTIONAL SCALE  Place score of 0-10 (0 = unable to perform activity and 10 = able to perform activity at the same level as before injury or problem)  Activity Date: 11/13    Sitting to standing 4    2.  Turning 3    3.  Steps 5         Average Total Score 4      Total Score = Sum of activity scores/number of activities  Minimally Detectable Change: 3 points (for single activity); 2 points (for average score)  [facility] (nd). The Patient Specific Functional Scale . Retrieved from [email or web address]   COGNITION: Overall cognitive status: Within functional limits  for tasks assessed     SENSATION: WFL  POSTURE: No Significant postural limitations   LOWER EXTREMITY ROM: WNL  LOWER EXTREMITY MMT:  MMT Right eval Left eval  Hip flexion 4 4  Hip extension    Hip abduction    Hip adduction    Hip internal rotation    Hip external rotation    Knee flexion 4 4  Knee extension 4 4  Ankle dorsiflexion    Ankle plantarflexion    Ankle inversion    Ankle eversion     (Blank rows = not tested)  LOWER EXTREMITY SPECIAL TESTS:    FUNCTIONAL TESTS:  5 times sit to stand: 25.09 sec  with UE assist Berg Balance Test: 45/56 DGI: 15/24     GAIT: Distance walked: Into clinic Assistive device utilized: None Level of assistance: Complete Independence Comments: Slow and guarded, diminished step length bilat, diminished arm swing bilat                                                                                                                                TREATMENT DATE: 12/05/23                                     EXERCISE LOG   Balance  Exercise Repetitions and Resistance Comments  Nustep L1 x 16 mins    Rocker board  X 6 mins   Tandem stance  2x holds   8in box  Toe taps  2x20   LAQs 2# 2x10 Bil   Seated marching 2 # 2x 10 Bil   Sit to stand    Seated add ball squeeze Hold 5 secs x 3 mins   clamshell    STS  X5 with UE assist   19.92 secs    Blank cell = exercise not performed today  Discussed HEP  PATIENT EDUCATION:  Education details: Exam findings, POC Person educated: Patient Education method: Explanation, Demonstration, and Handouts Education comprehension: verbalized understanding, returned demonstration, and needs further education  HOME EXERCISE PROGRAM: To be initiated  ASSESSMENT:  CLINICAL IMPRESSION: Patient arrived today doing fairly well and reports no falls or close calls. Pt also reports improved balance inside her house. Rx focused  again on therex for strengthening as well as balance act.'s.Pt still needing SBA during balance act's and UE assist PRN.     OBJECTIVE IMPAIRMENTS: Abnormal gait, decreased balance, decreased endurance, decreased mobility, difficulty walking, decreased strength, and postural dysfunction.   ACTIVITY LIMITATIONS: standing, stairs, transfers, and locomotion level  PARTICIPATION LIMITATIONS: meal prep, cleaning, shopping, community activity, and yard work  PERSONAL FACTORS: Age, Fitness, Past/current experiences, and Time since onset of injury/illness/exacerbation are also affecting patient's  functional outcome.   REHAB POTENTIAL: Good  CLINICAL DECISION MAKING: Evolving/moderate complexity  EVALUATION COMPLEXITY: Moderate   GOALS: Goals reviewed with patient? Yes  SHORT TERM GOALS: Target date: 12/14/2023  Pt will be ind with initial HEP Baseline: Goal status: INITIAL  2.  Pt will have improved 5x STS to </=20 sec to demo increased functional LE strength Baseline:  Goal status: INITIAL   LONG TERM GOALS: Target date: 01/11/2024   Pt will be ind with management and progression of HEP Baseline:  Goal status: On going  2.  Pt will have improved PSFS score to >/=6 to demo MCID Baseline:  Goal status: On going  3.  Pt will have improved Berg Balance Score to >/=52/56 to demo low fall risk Baseline:  Goal status: On going  4.  Pt will have improved DGI to >/=20/24 to demo low fall risk Baseline:  Goal status: On going     PLAN:  PT FREQUENCY: 2x/week  PT DURATION: 8 weeks  PLANNED INTERVENTIONS: 97164- PT Re-evaluation, 97110-Therapeutic exercises, 97530- Therapeutic activity, [id number]- Neuromuscular re-education, 97535- Self Care, [id number]- Manual therapy, [id number]- Gait training, [phone]- Electrical stimulation (unattended), Patient/Family education, Balance training, Stair training, Taping, Cryotherapy, and Moist heat  PLAN FOR NEXT SESSION: Initiate balance and strength HEP.    Wynema Garoutte,CHRIS, PTA 12/05/2023, 3:49 PM

## 2023-12-07 ENCOUNTER — Ambulatory Visit: Admitting: Physical Therapy

## 2023-12-07 DIAGNOSIS — R2689 Other abnormalities of gait and mobility: Secondary | ICD-10-CM

## 2023-12-07 DIAGNOSIS — R2681 Unsteadiness on feet: Secondary | ICD-10-CM

## 2023-12-07 DIAGNOSIS — M6281 Muscle weakness (generalized): Secondary | ICD-10-CM | POA: Diagnosis not present

## 2023-12-07 NOTE — Therapy (Signed)
 OUTPATIENT PHYSICAL THERAPY LOWER EXTREMITY TREATMENT   Patient Name: Helen [name] MRN: [id number] DOB:04/12/1945, 78 y.o., female Today's Date: 12/07/2023  END OF SESSION:  PT End of Session - 12/07/23 1310     Visit Number 5    Number of Visits 16    Date for Recertification  01/11/24    Authorization Type [employer] Medicare    PT Start Time 0102    PT Stop Time 0146    PT Time Calculation (min) 44 min    Activity Tolerance Patient tolerated treatment well    Behavior During Therapy [facility] for tasks assessed/performed            Past Medical History:  Diagnosis Date   Ankle fracture, right 2014   Closed left arm fracture    Diabetes mellitus without complication (HCC)    Hypertension    Thyroid  disease    Urinary incontinence    Past Surgical History:  Procedure Laterality Date   ABDOMINAL HYSTERECTOMY  1977   menorrhagia   CYST EXCISION  1962   Spine   THYROID  SURGERY  1986   Thyroid  Nodule removal    Patient Active Problem List   Diagnosis Date Noted   Urge incontinence 06/01/2023   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   Chronic vulvitis 06/01/2023   Vitamin D deficiency 06/12/2017   Type II diabetes mellitus, well controlled (HCC) 09/14/2016   Long-term insulin use (HCC) 06/26/2013   Hypothyroidism 04/15/2010   Hyperlipidemia LDL goal <100 04/15/2010   Essential hypertension 04/15/2010   Osteoarthrosis involving upper arm 07/20/2007    PCP: Lenon [name], FNP  REFERRING PROVIDER: Arby Lyle LABOR, NP  REFERRING DIAG: Z91.81 (ICD-10-CM) - History of falling  THERAPY DIAG:  Muscle weakness (generalized)  Unsteadiness on feet  Other abnormalities of gait and mobility  Rationale for Evaluation and Treatment: Rehabilitation  ONSET DATE: last few months   SUBJECTIVE:   SUBJECTIVE STATEMENT: Doing good.  Recommended she use a cane or walking stick for additional safety.  PERTINENT HISTORY: Skin cancer, low back pain  PAIN:  Are  you having pain? No Back pain with over exertion, diminishes with rest  PRECAUTIONS: Fall  RED FLAGS: None   WEIGHT BEARING RESTRICTIONS: No  FALLS:  Has patient fallen in last 6 months? Yes. Number of falls 5-6 times; last fall last week (fell 2 times in one day). Has fallen inside and outside the house  LIVING ENVIRONMENT: Lives with: lives alone Lives in: House/apartment Stairs: 5 steps in the back with a rail Has following equipment at home: Single point cane, Shower bench, and Grab bars  OCCUPATION: Likes to take her dog for a walk  PLOF: Independent  PATIENT GOALS: Decrease pain, sleep longer, improve strength, stand longer, less difficulty with home activities  NEXT MD VISIT: PRN  OBJECTIVE:  Note: Objective measures were completed at Evaluation unless otherwise noted.  DIAGNOSTIC FINDINGS: none  PATIENT SURVEYS:  PSFS: THE PATIENT SPECIFIC FUNCTIONAL SCALE  Place score of 0-10 (0 = unable to perform activity and 10 = able to perform activity at the same level as before injury or problem)  Activity Date: 11/13    Sitting to standing 4    2.  Turning 3    3.  Steps 5         Average Total Score 4      Total Score = Sum of activity scores/number of activities  Minimally Detectable Change: 3 points (for single activity); 2 points (for average  score)  [facility] (nd). The Patient Specific Functional Scale . Retrieved from [email or web address]   COGNITION: Overall cognitive status: Within functional limits for tasks assessed     SENSATION: WFL  POSTURE: No Significant postural limitations   LOWER EXTREMITY ROM: WNL  LOWER EXTREMITY MMT:  MMT Right eval Left eval  Hip flexion 4 4  Hip extension    Hip abduction    Hip adduction    Hip internal rotation    Hip external rotation    Knee flexion 4 4  Knee extension 4 4  Ankle dorsiflexion    Ankle plantarflexion    Ankle  inversion    Ankle eversion     (Blank rows = not tested)  LOWER EXTREMITY SPECIAL TESTS:    FUNCTIONAL TESTS:  5 times sit to stand: 25.09 sec with UE assist Berg Balance Test: 45/56 DGI: 15/24     GAIT: Distance walked: Into clinic Assistive device utilized: None Level of assistance: Complete Independence Comments: Slow and guarded, diminished step length bilat, diminished arm swing bilat                                                                                                                                TREATMENT DATE:   12/07/23:                                     EXERCISE LOG  Exercise Repetitions and Resistance Comments  Nustep Level 4 x 10 minutes, 5 minutes on level 2   Rockerboard  5 minutes in parallel bars    Airex balance pad 5 minutes in parallel bars    Seated hip add 4 minute ball squeezes   Seated hip abd 4 minutes with red theraband    LAQ's 3# x 5 minutes       12/05/23                                     EXERCISE LOG   Balance  Exercise Repetitions and Resistance Comments  Nustep L1 x 16 mins    Rocker board  X 6 mins   Tandem stance  2x holds   8in box  Toe taps  2x20   LAQs 2# 2x10 Bil   Seated marching 2 # 2x 10 Bil   Sit to stand    Seated add ball squeeze Hold 5 secs x 3 mins   clamshell    STS  X5 with UE assist   19.92 secs    Blank cell = exercise not performed today  Discussed HEP  PATIENT EDUCATION:  Education details: Exam findings, POC Person educated: Patient Education method: Explanation, Demonstration, and Handouts Education comprehension: verbalized understanding, returned demonstration, and needs further education  HOME EXERCISE PROGRAM: To  be initiated  ASSESSMENT:  CLINICAL IMPRESSION: Patient did very well with treatment today and with increased resistance on Nustep.  Provided patient with red theraband for seated hip abduction.  Recommended she use a cane/walking stick for additional safety.     OBJECTIVE IMPAIRMENTS: Abnormal gait, decreased balance, decreased endurance, decreased mobility, difficulty walking, decreased strength, and postural dysfunction.   ACTIVITY LIMITATIONS: standing, stairs, transfers, and locomotion level  PARTICIPATION LIMITATIONS: meal prep, cleaning, shopping, community activity, and yard work  PERSONAL FACTORS: Age, Fitness, Past/current experiences, and Time since onset of injury/illness/exacerbation are also affecting patient's functional outcome.   REHAB POTENTIAL: Good  CLINICAL DECISION MAKING: Evolving/moderate complexity  EVALUATION COMPLEXITY: Moderate   GOALS: Goals reviewed with patient? Yes  SHORT TERM GOALS: Target date: 12/14/2023  Pt [name] be ind with initial HEP Baseline: Goal status: INITIAL  2.  Pt [name] have improved 5x STS to </=20 sec to demo increased functional LE strength Baseline:  Goal status: INITIAL   LONG TERM GOALS: Target date: 01/11/2024   Pt [name] be ind with management and progression of HEP Baseline:  Goal status: On going  2.  Pt [name] have improved PSFS score to >/=6 to demo MCID Baseline:  Goal status: On going  3.  Pt [name] have improved Berg Balance Score to >/=52/56 to demo low fall risk Baseline:  Goal status: On going  4.  Pt [name] have improved DGI to >/=20/24 to demo low fall risk Baseline:  Goal status: On going     PLAN:  PT FREQUENCY: 2x/week  PT DURATION: 8 weeks  PLANNED INTERVENTIONS: 97164- PT Re-evaluation, 97110-Therapeutic exercises, 97530- Therapeutic activity, [id number]- Neuromuscular re-education, 97535- Self Care, 02859- Manual therapy, [id number]- Gait training, [phone]- Electrical stimulation (unattended), Patient/Family education, Balance training, Stair training, Taping, Cryotherapy, and Moist heat  PLAN FOR NEXT SESSION: Initiate balance and strength HEP.    Bobbyjo Marulanda, PT 12/07/2023, 1:46 PM

## 2023-12-12 ENCOUNTER — Ambulatory Visit: Admitting: *Deleted

## 2023-12-14 ENCOUNTER — Ambulatory Visit: Admitting: *Deleted

## 2023-12-18 ENCOUNTER — Ambulatory Visit: Admitting: Physical Therapy

## 2023-12-18 DIAGNOSIS — M6281 Muscle weakness (generalized): Secondary | ICD-10-CM | POA: Diagnosis not present

## 2023-12-18 DIAGNOSIS — R2681 Unsteadiness on feet: Secondary | ICD-10-CM

## 2023-12-18 DIAGNOSIS — R2689 Other abnormalities of gait and mobility: Secondary | ICD-10-CM

## 2023-12-18 NOTE — Therapy (Signed)
 OUTPATIENT PHYSICAL THERAPY LOWER EXTREMITY TREATMENT   Patient Name: Helen [name] MRN: [id number] DOB:1945/01/31, 78 y.o., female Today's Date: 12/18/2023  END OF SESSION:  PT End of Session - 12/18/23 1127     Visit Number 6    Number of Visits 16    Authorization Type [employer] Medicare    PT Start Time 1105    Activity Tolerance Patient tolerated treatment well    Behavior During Therapy [facility] for tasks assessed/performed            Past Medical History:  Diagnosis Date   Ankle fracture, right 2014   Closed left arm fracture    Diabetes mellitus without complication (HCC)    Hypertension    Thyroid  disease    Urinary incontinence    Past Surgical History:  Procedure Laterality Date   ABDOMINAL HYSTERECTOMY  1977   menorrhagia   CYST EXCISION  1962   Spine   THYROID  SURGERY  1986   Thyroid  Nodule removal    Patient Active Problem List   Diagnosis Date Noted   Urge incontinence 06/01/2023   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   Chronic vulvitis 06/01/2023   Vitamin D deficiency 06/12/2017   Type II diabetes mellitus, well controlled (HCC) 09/14/2016   Long-term insulin use (HCC) 06/26/2013   Hypothyroidism 04/15/2010   Hyperlipidemia LDL goal <100 04/15/2010   Essential hypertension 04/15/2010   Osteoarthrosis involving upper arm 07/20/2007    PCP: Lenon [name], FNP  REFERRING PROVIDER: Arby Lyle LABOR, NP  REFERRING DIAG: Z91.81 (ICD-10-CM) - History of falling  THERAPY DIAG:  Muscle weakness (generalized)  Unsteadiness on feet  Other abnormalities of gait and mobility  Rationale for Evaluation and Treatment: Rehabilitation  ONSET DATE: last few months   SUBJECTIVE:   SUBJECTIVE STATEMENT: No new complaints.  PERTINENT HISTORY: Skin cancer, low back pain  PAIN:  Are you having pain? No Back pain with over exertion, diminishes with rest  PRECAUTIONS: Fall  RED FLAGS: None   WEIGHT BEARING RESTRICTIONS: No  FALLS:   Has patient fallen in last 6 months? Yes. Number of falls 5-6 times; last fall last week (fell 2 times in one day). Has fallen inside and outside the house  LIVING ENVIRONMENT: Lives with: lives alone Lives in: House/apartment Stairs: 5 steps in the back with a rail Has following equipment at home: Single point cane, Shower bench, and Grab bars  OCCUPATION: Likes to take her dog for a walk  PLOF: Independent  PATIENT GOALS: Decrease pain, sleep longer, improve strength, stand longer, less difficulty with home activities  NEXT MD VISIT: PRN  OBJECTIVE:  Note: Objective measures were completed at Evaluation unless otherwise noted.  DIAGNOSTIC FINDINGS: none  PATIENT SURVEYS:  PSFS: THE PATIENT SPECIFIC FUNCTIONAL SCALE  Place score of 0-10 (0 = unable to perform activity and 10 = able to perform activity at the same level as before injury or problem)  Activity Date: 11/13    Sitting to standing 4    2.  Turning 3    3.  Steps 5         Average Total Score 4      Total Score = Sum of activity scores/number of activities  Minimally Detectable Change: 3 points (for single activity); 2 points (for average score)  [facility] (nd). The Patient Specific Functional Scale . Retrieved from [email or web address]   COGNITION: Overall cognitive status: Within functional limits for tasks assessed     SENSATION: [facility]  POSTURE: No Significant postural limitations   LOWER EXTREMITY ROM: WNL  LOWER EXTREMITY MMT:  MMT Right eval Left eval  Hip flexion 4 4  Hip extension    Hip abduction    Hip adduction    Hip internal rotation    Hip external rotation    Knee flexion 4 4  Knee extension 4 4  Ankle dorsiflexion    Ankle plantarflexion    Ankle inversion    Ankle eversion     (Blank rows = not tested)  LOWER EXTREMITY SPECIAL TESTS:    FUNCTIONAL TESTS:  5 times sit to stand: 25.09 sec with UE  assist Berg Balance Test: 45/56 DGI: 15/24     GAIT: Distance walked: Into clinic Assistive device utilized: None Level of assistance: Complete Independence Comments: Slow and guarded, diminished step length bilat, diminished arm swing bilat                                                                                                                                TREATMENT DATE:   12/18/23:                                     EXERCISE LOG  Exercise Repetitions and Resistance Comments  Nustep Level 3 x 15 minutes    Rockerboard  5 minutes    Hip add with ball 4 minutes    Hip abduction Red theraband x 4 minutes    LAQ's  4# x 4 minutes    Forward walking against blue theratube 3 minutes    Backward walking against blue theratube 3 minutes       12/07/23:                                     EXERCISE LOG  Exercise Repetitions and Resistance Comments  Nustep Level 4 x 10 minutes, 5 minutes on level 2   Rockerboard  5 minutes in parallel bars    Airex balance pad 5 minutes in parallel bars    Seated hip add 4 minute ball squeezes   Seated hip abd 4 minutes with red theraband    LAQ's 3# x 5 minutes       12/05/23                                     EXERCISE LOG   Balance  Exercise Repetitions and Resistance Comments  Nustep L1 x 16 mins    Rocker board  X 6 mins   Tandem stance  2x holds   8in box  Toe taps  2x20   LAQs 2# 2x10 Bil   Seated marching 2 # 2x 10 Bil  Sit to stand    Seated add ball squeeze Hold 5 secs x 3 mins   clamshell    STS  X5 with UE assist   19.92 secs    Blank cell = exercise not performed today  Discussed HEP  PATIENT EDUCATION:  Education details: Exam findings, POC Person educated: Patient Education method: Explanation, Demonstration, and Handouts Education comprehension: verbalized understanding, returned demonstration, and needs further education  HOME EXERCISE PROGRAM: To be initiated  ASSESSMENT:  CLINICAL  IMPRESSION: Patient did very well with treatment today and with increased of LAQ exercise and the addition of resisted walking.  OBJECTIVE IMPAIRMENTS: Abnormal gait, decreased balance, decreased endurance, decreased mobility, difficulty walking, decreased strength, and postural dysfunction.   ACTIVITY LIMITATIONS: standing, stairs, transfers, and locomotion level  PARTICIPATION LIMITATIONS: meal prep, cleaning, shopping, community activity, and yard work  PERSONAL FACTORS: Age, Fitness, Past/current experiences, and Time since onset of injury/illness/exacerbation are also affecting patient's functional outcome.   REHAB POTENTIAL: Good  CLINICAL DECISION MAKING: Evolving/moderate complexity  EVALUATION COMPLEXITY: Moderate   GOALS: Goals reviewed with patient? Yes  SHORT TERM GOALS: Target date: 12/14/2023  Pt will be ind with initial HEP Baseline: Goal status: INITIAL  2.  Pt will have improved 5x STS to </=20 sec to demo increased functional LE strength Baseline:  Goal status: INITIAL   LONG TERM GOALS: Target date: 01/11/2024   Pt will be ind with management and progression of HEP Baseline:  Goal status: On going  2.  Pt will have improved PSFS score to >/=6 to demo MCID Baseline:  Goal status: On going  3.  Pt will have improved Berg Balance Score to >/=52/56 to demo low fall risk Baseline:  Goal status: On going  4.  Pt will have improved DGI to >/=20/24 to demo low fall risk Baseline:  Goal status: On going     PLAN:  PT FREQUENCY: 2x/week  PT DURATION: 8 weeks  PLANNED INTERVENTIONS: 97164- PT Re-evaluation, 97110-Therapeutic exercises, 97530- Therapeutic activity, [id number]- Neuromuscular re-education, 97535- Self Care, 02859- Manual therapy, [id number]- Gait training, [phone]- Electrical stimulation (unattended), Patient/Family education, Balance training, Stair training, Taping, Cryotherapy, and Moist heat  PLAN FOR NEXT SESSION: Initiate balance and  strength HEP.    Jamison Soward, PT 12/18/2023, 11:27 AM

## 2023-12-20 ENCOUNTER — Ambulatory Visit

## 2023-12-20 DIAGNOSIS — M6281 Muscle weakness (generalized): Secondary | ICD-10-CM | POA: Diagnosis not present

## 2023-12-20 DIAGNOSIS — R2689 Other abnormalities of gait and mobility: Secondary | ICD-10-CM

## 2023-12-20 DIAGNOSIS — R2681 Unsteadiness on feet: Secondary | ICD-10-CM

## 2023-12-20 NOTE — Therapy (Signed)
 OUTPATIENT PHYSICAL THERAPY LOWER EXTREMITY TREATMENT   Patient Name: Helen [name] MRN: [id number] DOB:1945/09/06, 78 y.o., female Today's Date: 12/20/2023  END OF SESSION:  PT End of Session - 12/20/23 1403     Visit Number 7    Number of Visits 16    Authorization Type [employer]    PT Start Time 1401    PT Stop Time 1447    PT Time Calculation (min) 46 min    Activity Tolerance Patient tolerated treatment well    Behavior During Therapy WFL for tasks assessed/performed            Past Medical History:  Diagnosis Date   Ankle fracture, right 2014   Closed left arm fracture    Diabetes mellitus without complication (HCC)    Hypertension    Thyroid  disease    Urinary incontinence    Past Surgical History:  Procedure Laterality Date   ABDOMINAL HYSTERECTOMY  1977   menorrhagia   CYST EXCISION  1962   Spine   THYROID  SURGERY  1986   Thyroid  Nodule removal    Patient Active Problem List   Diagnosis Date Noted   Urge incontinence 06/01/2023   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   Chronic vulvitis 06/01/2023   Vitamin D deficiency 06/12/2017   Type II diabetes mellitus, well controlled (HCC) 09/14/2016   Long-term insulin use (HCC) 06/26/2013   Hypothyroidism 04/15/2010   Hyperlipidemia LDL goal <100 04/15/2010   Essential hypertension 04/15/2010   Osteoarthrosis involving upper arm 07/20/2007    PCP: Lenon [name], FNP  REFERRING PROVIDER: Arby Lyle LABOR, NP  REFERRING DIAG: Z91.81 (ICD-10-CM) - History of falling  THERAPY DIAG:  Muscle weakness (generalized)  Unsteadiness on feet  Other abnormalities of gait and mobility  Rationale for Evaluation and Treatment: Rehabilitation  ONSET DATE: last few months   SUBJECTIVE:   SUBJECTIVE STATEMENT: Pt denies any pain today.   PERTINENT HISTORY: Skin cancer, low back pain  PAIN:  Are you having pain? No Back pain with over exertion, diminishes with rest  PRECAUTIONS:  Fall  RED FLAGS: None   WEIGHT BEARING RESTRICTIONS: No  FALLS:  Has patient fallen in last 6 months? Yes. Number of falls 5-6 times; last fall last week (fell 2 times in one day). Has fallen inside and outside the house  LIVING ENVIRONMENT: Lives with: lives alone Lives in: House/apartment Stairs: 5 steps in the back with a rail Has following equipment at home: Single point cane, Shower bench, and Grab bars  OCCUPATION: Likes to take her dog for a walk  PLOF: Independent  PATIENT GOALS: Decrease pain, sleep longer, improve strength, stand longer, less difficulty with home activities  NEXT MD VISIT: PRN  OBJECTIVE:  Note: Objective measures were completed at Evaluation unless otherwise noted.  DIAGNOSTIC FINDINGS: none  PATIENT SURVEYS:  PSFS: THE PATIENT SPECIFIC FUNCTIONAL SCALE  Place score of 0-10 (0 = unable to perform activity and 10 = able to perform activity at the same level as before injury or problem)  Activity Date: 11/13    Sitting to standing 4    2.  Turning 3    3.  Steps 5         Average Total Score 4      Total Score = Sum of activity scores/number of activities  Minimally Detectable Change: 3 points (for single activity); 2 points (for average score)  [facility] (nd). The Patient Specific Functional Scale . Retrieved from [email or web address]  COGNITION: Overall cognitive status: Within functional limits for tasks assessed     SENSATION: WFL  POSTURE: No Significant postural limitations   LOWER EXTREMITY ROM: WNL  LOWER EXTREMITY MMT:  MMT Right eval Left eval  Hip flexion 4 4  Hip extension    Hip abduction    Hip adduction    Hip internal rotation    Hip external rotation    Knee flexion 4 4  Knee extension 4 4  Ankle dorsiflexion    Ankle plantarflexion    Ankle inversion    Ankle eversion     (Blank rows = not tested)  LOWER EXTREMITY SPECIAL TESTS:     FUNCTIONAL TESTS:  5 times sit to stand: 25.09 sec with UE assist Berg Balance Test: 45/56 DGI: 15/24     GAIT: Distance walked: Into clinic Assistive device utilized: None Level of assistance: Complete Independence Comments: Slow and guarded, diminished step length bilat, diminished arm swing bilat                                                                                                                                TREATMENT DATE:    12/20/23                                  EXERCISE LOG  Exercise Repetitions and Resistance Comments  Nustep Lvl 3 x 15 mins   Rockerboard 5 mins   Forward Step Ups 6 box x 30 reps bil   Tandem Gait 4 mins   Side Stepping 4 mins   LAQs 5# x 3 mins   Seated Marches 5# x 3 mins   Seated Hip Abduction Red x 4 mins   Seated Hip Adduction    Seated Ham Curls Red x 25 reps bil   STS     Blank cell = exercise not performed today    12/18/23:                                     EXERCISE LOG  Exercise Repetitions and Resistance Comments  Nustep Level 3 x 15 minutes    Rockerboard  5 minutes    Hip add with ball 4 minutes    Hip abduction Red theraband x 4 minutes    LAQ's  4# x 4 minutes    Forward walking against blue theratube 3 minutes    Backward walking against blue theratube 3 minutes       12/07/23:                                     EXERCISE LOG  Exercise Repetitions and Resistance Comments  Nustep Level 4 x 10 minutes, 5 minutes on level  2   Rockerboard  5 minutes in parallel bars    Airex balance pad 5 minutes in parallel bars    Seated hip add 4 minute ball squeezes   Seated hip abd 4 minutes with red theraband    LAQ's 3# x 5 minutes       12/05/23                                     EXERCISE LOG   Balance  Exercise Repetitions and Resistance Comments  Nustep L1 x 16 mins    Rocker board  X 6 mins   Tandem stance  2x holds   8in box  Toe taps  2x20   LAQs 2# 2x10 Bil   Seated marching 2 #  2x 10 Bil   Sit to stand    Seated add ball squeeze Hold 5 secs x 3 mins   clamshell    STS  X5 with UE assist   19.92 secs    Blank cell = exercise not performed today  Discussed HEP  PATIENT EDUCATION:  Education details: Exam findings, POC Person educated: Patient Education method: Explanation, Demonstration, and Handouts Education comprehension: verbalized understanding, returned demonstration, and needs further education  HOME EXERCISE PROGRAM: To be initiated  ASSESSMENT:  CLINICAL IMPRESSION: Pt arrives for today's treatment session denying any pain. Pt introduced to several new standing exercises for balance, strengthening, and function.  Pt requiring min cues for proper technique with all newly added exercises.  Pt able to tolerate increased resistance with several seated exercises with good results and without discomfort.  Pt denied any pain at completion of today's treatment session, but does required increased fatigue.    OBJECTIVE IMPAIRMENTS: Abnormal gait, decreased balance, decreased endurance, decreased mobility, difficulty walking, decreased strength, and postural dysfunction.   ACTIVITY LIMITATIONS: standing, stairs, transfers, and locomotion level  PARTICIPATION LIMITATIONS: meal prep, cleaning, shopping, community activity, and yard work  PERSONAL FACTORS: Age, Fitness, Past/current experiences, and Time since onset of injury/illness/exacerbation are also affecting patient's functional outcome.   REHAB POTENTIAL: Good  CLINICAL DECISION MAKING: Evolving/moderate complexity  EVALUATION COMPLEXITY: Moderate   GOALS: Goals reviewed with patient? Yes  SHORT TERM GOALS: Target date: 12/14/2023  Pt will be ind with initial HEP Baseline: Goal status: INITIAL  2.  Pt will have improved 5x STS to </=20 sec to demo increased functional LE strength Baseline:  Goal status: INITIAL   LONG TERM GOALS: Target date: 01/11/2024   Pt will be ind with management  and progression of HEP Baseline:  Goal status: On going  2.  Pt will have improved PSFS score to >/=6 to demo MCID Baseline:  Goal status: On going  3.  Pt will have improved Berg Balance Score to >/=52/56 to demo low fall risk Baseline:  Goal status: On going  4.  Pt will have improved DGI to >/=20/24 to demo low fall risk Baseline:  Goal status: On going     PLAN:  PT FREQUENCY: 2x/week  PT DURATION: 8 weeks  PLANNED INTERVENTIONS: 97164- PT Re-evaluation, 97110-Therapeutic exercises, 97530- Therapeutic activity, [id number]- Neuromuscular re-education, 97535- Self Care, [id number]- Manual therapy, [id number]- Gait training, [phone]- Electrical stimulation (unattended), Patient/Family education, Balance training, Stair training, Taping, Cryotherapy, and Moist heat  PLAN FOR NEXT SESSION: Initiate balance and strength HEP.    Delon DELENA Gosling, PTA 12/20/2023, 2:50 PM

## 2023-12-26 ENCOUNTER — Ambulatory Visit: Admitting: Physical Therapy

## 2023-12-26 DIAGNOSIS — R2689 Other abnormalities of gait and mobility: Secondary | ICD-10-CM

## 2023-12-26 DIAGNOSIS — M6281 Muscle weakness (generalized): Secondary | ICD-10-CM

## 2023-12-26 DIAGNOSIS — R2681 Unsteadiness on feet: Secondary | ICD-10-CM

## 2023-12-26 NOTE — Therapy (Deleted)
 [facility] Outpatient Rehabilitation at [facility] [address] [place], [place], [address] Phone: [phone]   Fax:  [phone]  Patient Details  Name: Helen [name] MRN: [id number] Date of Birth: 14-Dec-1945 Referring Provider:  Lenon [name], FNP  Encounter Date: 12/26/2023   Danyale Ridinger, PT 12/26/2023, 2:07 PM  [facility] Outpatient Rehabilitation at [facility] [address] [place], [place], [address] Phone: [phone]   Fax:  [phone]

## 2023-12-26 NOTE — Therapy (Signed)
 " OUTPATIENT PHYSICAL THERAPY LOWER EXTREMITY TREATMENT   Patient Name: Helen [name] MRN: [id number] DOB:1945/10/26, 78 y.o., female Today's Date: 12/26/2023  END OF SESSION:  PT End of Session - 12/26/23 1407     Visit Number 8    Number of Visits 16    Date for Recertification  01/11/24    PT Start Time 0153    PT Stop Time 0228    PT Time Calculation (min) 35 min    Activity Tolerance Patient tolerated treatment well    Behavior During Therapy [facility] for tasks assessed/performed            Past Medical History:  Diagnosis Date   Ankle fracture, right 2014   Closed left arm fracture    Diabetes mellitus without complication (HCC)    Hypertension    Thyroid  disease    Urinary incontinence    Past Surgical History:  Procedure Laterality Date   ABDOMINAL HYSTERECTOMY  1977   menorrhagia   CYST EXCISION  1962   Spine   THYROID  SURGERY  1986   Thyroid  Nodule removal    Patient Active Problem List   Diagnosis Date Noted   Urge incontinence 06/01/2023   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   Chronic vulvitis 06/01/2023   Vitamin D deficiency 06/12/2017   Type II diabetes mellitus, well controlled (HCC) 09/14/2016   Long-term insulin use (HCC) 06/26/2013   Hypothyroidism 04/15/2010   Hyperlipidemia LDL goal <100 04/15/2010   Essential hypertension 04/15/2010   Osteoarthrosis involving upper arm 07/20/2007    PCP: Lenon [name], FNP  REFERRING PROVIDER: Arby Lyle LABOR, NP  REFERRING DIAG: Z91.81 (ICD-10-CM) - History of falling  THERAPY DIAG:  Muscle weakness (generalized)  Unsteadiness on feet  Other abnormalities of gait and mobility  Rationale for Evaluation and Treatment: Rehabilitation  ONSET DATE: last few months   SUBJECTIVE:   SUBJECTIVE STATEMENT: Running late.  Doing good.    PERTINENT HISTORY: Skin cancer, low back pain  PAIN:  Are you having pain? No Back pain with over exertion, diminishes with  rest  PRECAUTIONS: Fall  RED FLAGS: None   WEIGHT BEARING RESTRICTIONS: No  FALLS:  Has patient fallen in last 6 months? Yes. Number of falls 5-6 times; last fall last week (fell 2 times in one day). Has fallen inside and outside the house  LIVING ENVIRONMENT: Lives with: lives alone Lives in: House/apartment Stairs: 5 steps in the back with a rail Has following equipment at home: Single point cane, Shower bench, and Grab bars  OCCUPATION: Likes to take her dog for a walk  PLOF: Independent  PATIENT GOALS: Decrease pain, sleep longer, improve strength, stand longer, less difficulty with home activities  NEXT MD VISIT: PRN  OBJECTIVE:  Note: Objective measures were completed at Evaluation unless otherwise noted.  DIAGNOSTIC FINDINGS: none  PATIENT SURVEYS:  PSFS: THE PATIENT SPECIFIC FUNCTIONAL SCALE  Place score of 0-10 (0 = unable to perform activity and 10 = able to perform activity at the same level as before injury or problem)  Activity Date: 11/13    Sitting to standing 4    2.  Turning 3    3.  Steps 5         Average Total Score 4      Total Score = Sum of activity scores/number of activities  Minimally Detectable Change: 3 points (for single activity); 2 points (for average score)  [facility] (nd). The Patient Specific Functional Scale .  Retrieved from [email or web address]   COGNITION: Overall cognitive status: Within functional limits for tasks assessed     SENSATION: WFL  POSTURE: No Significant postural limitations   LOWER EXTREMITY ROM: WNL  LOWER EXTREMITY MMT:  MMT Right eval Left eval  Hip flexion 4 4  Hip extension    Hip abduction    Hip adduction    Hip internal rotation    Hip external rotation    Knee flexion 4 4  Knee extension 4 4  Ankle dorsiflexion    Ankle plantarflexion    Ankle inversion    Ankle eversion     (Blank rows = not tested)  LOWER  EXTREMITY SPECIAL TESTS:    FUNCTIONAL TESTS:  5 times sit to stand: 25.09 sec with UE assist Berg Balance Test: 45/56 DGI: 15/24     GAIT: Distance walked: Into clinic Assistive device utilized: None Level of assistance: Complete Independence Comments: Slow and guarded, diminished step length bilat, diminished arm swing bilat                                                                                                                                TREATMENT DATE:   12/26/23:                                     EXERCISE LOG  Exercise Repetitions and Resistance Comments  Nustep Level 3 x 15 minutes    Rockerboard In parallel bars x 5 minutes    LAQ's 5# x 3 minutes    Marching 5# x 3 minutes    Seated hip add Ball squeeze x 3 minutes   Side stepping  On foam balance pad x 3 minutes in parallel bars       12/20/23                                  EXERCISE LOG  Exercise Repetitions and Resistance Comments  Nustep Lvl 3 x 15 mins   Rockerboard 5 mins   Forward Step Ups 6 box x 30 reps bil   Tandem Gait 4 mins   Side Stepping 4 mins   LAQs 5# x 3 mins   Seated Marches 5# x 3 mins   Seated Hip Abduction Red x 4 mins   Seated Hip Adduction    Seated Ham Curls Red x 25 reps bil   STS     Blank cell = exercise not performed today    12/18/23:                                     EXERCISE LOG  Exercise Repetitions and Resistance Comments  Nustep Level 3 x 15 minutes  Rockerboard  5 minutes    Hip add with ball 4 minutes    Hip abduction Red theraband x 4 minutes    LAQ's  4# x 4 minutes    Forward walking against blue theratube 3 minutes    Backward walking against blue theratube 3 minutes       12/07/23:                                     EXERCISE LOG  Exercise Repetitions and Resistance Comments  Nustep Level 4 x 10 minutes, 5 minutes on level 2   Rockerboard  5 minutes in parallel bars    Airex balance pad 5 minutes in parallel bars    Seated  hip add 4 minute ball squeezes   Seated hip abd 4 minutes with red theraband    LAQ's 3# x 5 minutes       12/05/23                                     EXERCISE LOG   Balance  Exercise Repetitions and Resistance Comments  Nustep L1 x 16 mins    Rocker board  X 6 mins   Tandem stance  2x holds   8in box  Toe taps  2x20   LAQs 2# 2x10 Bil   Seated marching 2 # 2x 10 Bil   Sit to stand    Seated add ball squeeze Hold 5 secs x 3 mins   clamshell    STS  X5 with UE assist   19.92 secs    Blank cell = exercise not performed today  Discussed HEP  PATIENT EDUCATION:  Education details: Exam findings, POC Person educated: Patient Education method: Programmer, Multimedia, Demonstration, and Handouts Education comprehension: verbalized understanding, returned demonstration, and needs further education  HOME EXERCISE PROGRAM: To be initiated  ASSESSMENT:  CLINICAL IMPRESSION: Patient did well with the addition of the foam balance pad with minimal hand usage on parallel bars.    OBJECTIVE IMPAIRMENTS: Abnormal gait, decreased balance, decreased endurance, decreased mobility, difficulty walking, decreased strength, and postural dysfunction.   ACTIVITY LIMITATIONS: standing, stairs, transfers, and locomotion level  PARTICIPATION LIMITATIONS: meal prep, cleaning, shopping, community activity, and yard work  PERSONAL FACTORS: Age, Fitness, Past/current experiences, and Time since onset of injury/illness/exacerbation are also affecting patient's functional outcome.   REHAB POTENTIAL: Good  CLINICAL DECISION MAKING: Evolving/moderate complexity  EVALUATION COMPLEXITY: Moderate   GOALS: Goals reviewed with patient? Yes  SHORT TERM GOALS: Target date: 12/14/2023  Pt will be ind with initial HEP Baseline: Goal status: INITIAL  2.  Pt will have improved 5x STS to </=20 sec to demo increased functional LE strength Baseline:  Goal status: INITIAL   LONG TERM GOALS: Target date:  01/11/2024   Pt will be ind with management and progression of HEP Baseline:  Goal status: On going  2.  Pt will have improved PSFS score to >/=6 to demo MCID Baseline:  Goal status: On going  3.  Pt will have improved Berg Balance Score to >/=52/56 to demo low fall risk Baseline:  Goal status: On going  4.  Pt will have improved DGI to >/=20/24 to demo low fall risk Baseline:  Goal status: On going     PLAN:  PT FREQUENCY: 2x/week  PT DURATION: 8  weeks  PLANNED INTERVENTIONS: 97164- PT Re-evaluation, 97110-Therapeutic exercises, 97530- Therapeutic activity, [id number]- Neuromuscular re-education, 97535- Self Care, 02859- Manual therapy, [phone]- Gait training, [phone]- Electrical stimulation (unattended), Patient/Family education, Balance training, Stair training, Taping, Cryotherapy, and Moist heat  PLAN FOR NEXT SESSION: Initiate balance and strength HEP.    Dustie Brittle, PT 12/26/2023, 3:05 PM  "

## 2024-01-03 ENCOUNTER — Ambulatory Visit: Admitting: Physical Therapy

## 2024-01-03 DIAGNOSIS — M6281 Muscle weakness (generalized): Secondary | ICD-10-CM | POA: Diagnosis not present

## 2024-01-03 DIAGNOSIS — R2681 Unsteadiness on feet: Secondary | ICD-10-CM

## 2024-01-03 DIAGNOSIS — R2689 Other abnormalities of gait and mobility: Secondary | ICD-10-CM

## 2024-01-03 NOTE — Therapy (Signed)
 " OUTPATIENT PHYSICAL THERAPY LOWER EXTREMITY TREATMENT   Patient Name: Helen [name] MRN: [id number] DOB:August 14, 1945, 78 y.o., female Today's Date: 01/03/2024  END OF SESSION:  PT End of Session - 01/03/24 1132     Visit Number 9    Number of Visits 16    Date for Recertification  01/11/24    Authorization Type [employer]    PT Start Time 1100    PT Stop Time 1145    PT Time Calculation (min) 45 min    Activity Tolerance Patient tolerated treatment well    Behavior During Therapy WFL for tasks assessed/performed            Past Medical History:  Diagnosis Date   Ankle fracture, right 2014   Closed left arm fracture    Diabetes mellitus without complication (HCC)    Hypertension    Thyroid  disease    Urinary incontinence    Past Surgical History:  Procedure Laterality Date   ABDOMINAL HYSTERECTOMY  1977   menorrhagia   CYST EXCISION  1962   Spine   THYROID  SURGERY  1986   Thyroid  Nodule removal    Patient Active Problem List   Diagnosis Date Noted   Urge incontinence 06/01/2023   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   Chronic vulvitis 06/01/2023   Vitamin D deficiency 06/12/2017   Type II diabetes mellitus, well controlled (HCC) 09/14/2016   Long-term insulin use (HCC) 06/26/2013   Hypothyroidism 04/15/2010   Hyperlipidemia LDL goal <100 04/15/2010   Essential hypertension 04/15/2010   Osteoarthrosis involving upper arm 07/20/2007    PCP: Lenon [name], FNP  REFERRING PROVIDER: Arby Lyle LABOR, NP  REFERRING DIAG: Z91.81 (ICD-10-CM) - History of falling  THERAPY DIAG:  Muscle weakness (generalized)  Unsteadiness on feet  Other abnormalities of gait and mobility  Rationale for Evaluation and Treatment: Rehabilitation  ONSET DATE: last few months   SUBJECTIVE:   SUBJECTIVE STATEMENT: Running late.  Doing good.    PERTINENT HISTORY: Skin cancer, low back pain  PAIN:  Are you having pain? No Back pain with over exertion,  diminishes with rest  PRECAUTIONS: Fall  RED FLAGS: None   WEIGHT BEARING RESTRICTIONS: No  FALLS:  Has patient fallen in last 6 months? Yes. Number of falls 5-6 times; last fall last week (fell 2 times in one day). Has fallen inside and outside the house  LIVING ENVIRONMENT: Lives with: lives alone Lives in: House/apartment Stairs: 5 steps in the back with a rail Has following equipment at home: Single point cane, Shower bench, and Grab bars  OCCUPATION: Likes to take her dog for a walk  PLOF: Independent  PATIENT GOALS: Decrease pain, sleep longer, improve strength, stand longer, less difficulty with home activities  NEXT MD VISIT: PRN  OBJECTIVE:  Note: Objective measures were completed at Evaluation unless otherwise noted.  DIAGNOSTIC FINDINGS: none  PATIENT SURVEYS:  PSFS: THE PATIENT SPECIFIC FUNCTIONAL SCALE  Place score of 0-10 (0 = unable to perform activity and 10 = able to perform activity at the same level as before injury or problem)  Activity Date: 11/13    Sitting to standing 4    2.  Turning 3    3.  Steps 5         Average Total Score 4      Total Score = Sum of activity scores/number of activities  Minimally Detectable Change: 3 points (for single activity); 2 points (for average score)  [facility] (  nd). The Patient Specific Functional Scale . Retrieved from [email or web address]   COGNITION: Overall cognitive status: Within functional limits for tasks assessed     SENSATION: WFL  POSTURE: No Significant postural limitations   LOWER EXTREMITY ROM: WNL  LOWER EXTREMITY MMT:  MMT Right eval Left eval  Hip flexion 4 4  Hip extension    Hip abduction    Hip adduction    Hip internal rotation    Hip external rotation    Knee flexion 4 4  Knee extension 4 4  Ankle dorsiflexion    Ankle plantarflexion    Ankle inversion    Ankle eversion     (Blank rows = not  tested)  LOWER EXTREMITY SPECIAL TESTS:    FUNCTIONAL TESTS:  5 times sit to stand: 25.09 sec with UE assist Berg Balance Test: 45/56 DGI: 15/24     GAIT: Distance walked: Into clinic Assistive device utilized: None Level of assistance: Complete Independence Comments: Slow and guarded, diminished step length bilat, diminished arm swing bilat                                                                                                                                TREATMENT DATE:   01/03/24:                                       EXERCISE LOG  Exercise Repetitions and Resistance Comments  Nustep Level 4 x 16 minutes   Rockerboard In parallel bars x 5 minutes    Balance beam In parallel bars x 7 minutes    Adductor squeezes with ball 4 minutes   LAQ's 4 minutes    Seated ham curls Red theraband 2 minutes each.       12/26/23:                                     EXERCISE LOG  Exercise Repetitions and Resistance Comments  Nustep Level 3 x 15 minutes    Rockerboard In parallel bars x 5 minutes    LAQ's 5# x 3 minutes    Marching 5# x 3 minutes    Seated hip add Ball squeeze x 3 minutes   Side stepping  On foam balance pad x 3 minutes in parallel bars       12/20/23                                  EXERCISE LOG  Exercise Repetitions and Resistance Comments  Nustep Lvl 3 x 15 mins   Rockerboard 5 mins   Forward Step Ups 6 box x 30 reps bil   Tandem Gait 4 mins   Side Stepping 4 mins  LAQs 5# x 3 mins   Seated Marches 5# x 3 mins   Seated Hip Abduction Red x 4 mins   Seated Hip Adduction    Seated Ham Curls Red x 25 reps bil   STS     Blank cell = exercise not performed today    12/18/23:                                     EXERCISE LOG  Exercise Repetitions and Resistance Comments  Nustep Level 3 x 15 minutes    Rockerboard  5 minutes    Hip add with ball 4 minutes    Hip abduction Red theraband x 4 minutes    LAQ's  4# x 4 minutes     Forward walking against blue theratube 3 minutes    Backward walking against blue theratube 3 minutes            PATIENT EDUCATION:  Education details: Exam findings, POC Person educated: Patient Education method: Explanation, Demonstration, and Handouts Education comprehension: verbalized understanding, returned demonstration, and needs further education  HOME EXERCISE PROGRAM: To be initiated  ASSESSMENT:  CLINICAL IMPRESSION: Patient did well today with balance interventions.  She does report PT is helping though when she first gets up she waits a short time before walking.   OBJECTIVE IMPAIRMENTS: Abnormal gait, decreased balance, decreased endurance, decreased mobility, difficulty walking, decreased strength, and postural dysfunction.   ACTIVITY LIMITATIONS: standing, stairs, transfers, and locomotion level  PARTICIPATION LIMITATIONS: meal prep, cleaning, shopping, community activity, and yard work  PERSONAL FACTORS: Age, Fitness, Past/current experiences, and Time since onset of injury/illness/exacerbation are also affecting patient's functional outcome.   REHAB POTENTIAL: Good  CLINICAL DECISION MAKING: Evolving/moderate complexity  EVALUATION COMPLEXITY: Moderate   GOALS: Goals reviewed with patient? Yes  SHORT TERM GOALS: Target date: 12/14/2023  Pt will be ind with initial HEP Baseline: Goal status: INITIAL  2.  Pt will have improved 5x STS to </=20 sec to demo increased functional LE strength Baseline:  Goal status: INITIAL   LONG TERM GOALS: Target date: 01/11/2024   Pt will be ind with management and progression of HEP Baseline:  Goal status: On going  2.  Pt will have improved PSFS score to >/=6 to demo MCID Baseline:  Goal status: On going  3.  Pt will have improved Berg Balance Score to >/=52/56 to demo low fall risk Baseline:  Goal status: On going  4.  Pt will have improved DGI to >/=20/24 to demo low fall risk Baseline:  Goal  status: On going     PLAN:  PT FREQUENCY: 2x/week  PT DURATION: 8 weeks  PLANNED INTERVENTIONS: [id number]- PT Re-evaluation, 97110-Therapeutic exercises, 97530- Therapeutic activity, [id number]- Neuromuscular re-education, 97535- Self Care, [id number]- Manual therapy, [id number]- Gait training, [phone]- Electrical stimulation (unattended), Patient/Family education, Balance training, Stair training, Taping, Cryotherapy, and Moist heat  PLAN FOR NEXT SESSION: Initiate balance and strength HEP.    Daejah Klebba, PT 01/03/2024, 11:52 AM  "

## 2024-01-09 ENCOUNTER — Ambulatory Visit: Admitting: Physical Therapy
